# Patient Record
Sex: Male | Born: 1975 | Race: White | Hispanic: No | Marital: Married | Smoking: Never smoker
Health system: Southern US, Community
[De-identification: ages and names within clinical notes are randomized; demographics above are authoritative.]

## PROBLEM LIST (undated history)

## (undated) DIAGNOSIS — M549 Dorsalgia, unspecified: Secondary | ICD-10-CM

## (undated) DIAGNOSIS — I10 Essential (primary) hypertension: Secondary | ICD-10-CM

## (undated) HISTORY — PX: EYE SURGERY: SHX253

---

## 2009-02-27 ENCOUNTER — Emergency Department (HOSPITAL_COMMUNITY): Admission: EM | Admit: 2009-02-27 | Discharge: 2009-02-27 | Payer: Self-pay | Admitting: Emergency Medicine

## 2009-03-06 ENCOUNTER — Emergency Department (HOSPITAL_COMMUNITY): Admission: EM | Admit: 2009-03-06 | Discharge: 2009-03-06 | Payer: Self-pay | Admitting: Emergency Medicine

## 2009-06-17 ENCOUNTER — Encounter: Admission: RE | Admit: 2009-06-17 | Discharge: 2009-06-17 | Payer: Self-pay | Admitting: Nurse Practitioner

## 2010-03-22 ENCOUNTER — Emergency Department (HOSPITAL_COMMUNITY): Admission: EM | Admit: 2010-03-22 | Discharge: 2010-03-22 | Payer: Self-pay | Admitting: Emergency Medicine

## 2011-09-21 ENCOUNTER — Emergency Department (HOSPITAL_COMMUNITY): Payer: Self-pay

## 2011-09-21 ENCOUNTER — Emergency Department (HOSPITAL_COMMUNITY)
Admission: EM | Admit: 2011-09-21 | Discharge: 2011-09-21 | Disposition: A | Discharge status: Home / Self Care | Payer: Self-pay | Attending: Emergency Medicine | Admitting: Emergency Medicine

## 2011-09-21 ENCOUNTER — Encounter (HOSPITAL_COMMUNITY): Payer: Self-pay | Admitting: *Deleted

## 2011-09-21 DIAGNOSIS — S8011XA Contusion of right lower leg, initial encounter: Secondary | ICD-10-CM

## 2011-09-21 DIAGNOSIS — M25569 Pain in unspecified knee: Secondary | ICD-10-CM | POA: Insufficient documentation

## 2011-09-21 DIAGNOSIS — Y9239 Other specified sports and athletic area as the place of occurrence of the external cause: Secondary | ICD-10-CM | POA: Insufficient documentation

## 2011-09-21 DIAGNOSIS — M79606 Pain in leg, unspecified: Secondary | ICD-10-CM

## 2011-09-21 DIAGNOSIS — S8990XA Unspecified injury of unspecified lower leg, initial encounter: Secondary | ICD-10-CM | POA: Insufficient documentation

## 2011-09-21 DIAGNOSIS — M25469 Effusion, unspecified knee: Secondary | ICD-10-CM | POA: Insufficient documentation

## 2011-09-21 DIAGNOSIS — Y92838 Other recreation area as the place of occurrence of the external cause: Secondary | ICD-10-CM | POA: Insufficient documentation

## 2011-09-21 DIAGNOSIS — S8010XA Contusion of unspecified lower leg, initial encounter: Secondary | ICD-10-CM | POA: Insufficient documentation

## 2011-09-21 DIAGNOSIS — W010XXA Fall on same level from slipping, tripping and stumbling without subsequent striking against object, initial encounter: Secondary | ICD-10-CM | POA: Insufficient documentation

## 2011-09-21 DIAGNOSIS — Y9367 Activity, basketball: Secondary | ICD-10-CM | POA: Insufficient documentation

## 2011-09-21 MED ORDER — OXYCODONE-ACETAMINOPHEN 5-325 MG PO TABS
2.0000 | ORAL_TABLET | Freq: Once | ORAL | Status: AC
  Administered 2011-09-21: 2 via ORAL
  Filled 2011-09-21: qty 2

## 2011-09-21 MED ORDER — HYDROCODONE-ACETAMINOPHEN 5-500 MG PO TABS: 1.0000 | ORAL_TABLET | Freq: Four times a day (QID) | ORAL | Status: AC | PRN

## 2011-09-21 NOTE — Discharge Instructions (Signed)
Continue to ice and elevate for pain control. You may continue to use Aleve for inflammation and pain, using hydrocodone-acetaminophen for breakthrough pain. However do not drive or operate machinery with hydrocodone-acetaminophen use. Common side effects with hydrocodone-acetaminophen are sedation, nausea, and constipation and therefore you may need an over-the-counter stool softener. Followup with orthopedic specialist in the next 1 to 2 weeks for recheck of ongoing pain however return to emergency department for any changing or worsening of symptoms.  Contusion A contusion is a deep bruise. Contusions are the result of an injury that caused bleeding under the skin. The contusion may turn blue, purple, or yellow. Minor injuries will give you a painless contusion, but more severe contusions may stay painful and swollen for a few weeks.  CAUSES  A contusion is usually caused by a blow, trauma, or direct force to an area of the body. SYMPTOMS   Swelling and redness of the injured area.   Bruising of the injured area.   Tenderness and soreness of the injured area.   Pain.  DIAGNOSIS  The diagnosis can be made by taking a history and physical exam. An X-ray, CT scan, or MRI may be needed to determine if there were any associated injuries, such as fractures. TREATMENT  Specific treatment will depend on what area of the body was injured. In general, the best treatment for a contusion is resting, icing, elevating, and applying cold compresses to the injured area. Over-the-counter medicines may also be recommended for pain control. Ask your caregiver what the best treatment is for your contusion. HOME CARE INSTRUCTIONS   Put ice on the injured area.   Put ice in a plastic bag.   Place a towel between your skin and the bag.   Leave the ice on for 15 to 20 minutes, 3 to 4 times a day.   Only take over-the-counter or prescription medicines for pain, discomfort, or fever as directed by your  caregiver. Your caregiver may recommend avoiding anti-inflammatory medicines (aspirin, ibuprofen, and naproxen) for 48 hours because these medicines may increase bruising.   Rest the injured area.   If possible, elevate the injured area to reduce swelling.  SEEK IMMEDIATE MEDICAL CARE IF:   You have increased bruising or swelling.   You have pain that is getting worse.   Your swelling or pain is not relieved with medicines.  MAKE SURE YOU:   Understand these instructions.   Will watch your condition.   Will get help right away if you are not doing well or get worse.  Document Released: 02/23/2005 Document Revised: 05/05/2011 Document Reviewed: 03/21/2011 [facility] Patient Information 2012 [place], [place].

## 2011-09-21 NOTE — ED Provider Notes (Signed)
History     CSN: [id number]  Arrival date & time 09/21/11  1821   First MD Initiated Contact with Patient 09/21/11 1853      Chief Complaint  Patient presents with  . Knee Pain    (Consider location/radiation/quality/duration/timing/severity/associated sxs/prior treatment) HPI  Patient presents to emergency department complaining of right knee and lower leg injury. Patient states that 4 days ago he was playing basketball and fell, twisting and landing on his right leg. Patient states that immediately he began to have pain and swelling that evening. Patient states that by the next day the swelling has improved but he had gradual onset of bruising to the medial aspect of his knee down to the medial to anterior aspect of his lower leg. Patient states that he has gone to work every day where he stands and each day the pain has gradually worsened. Patient states pain is aggravated by movement and prolonged standing. Patient has been taking Aleve without relief of pain. Patient states he has no known medical problems and takes no medicines on regular basis. He denies additional injury. Patient states he is able to ambulate by ambulation and weightbearing that aggravates pain in his right leg.  History reviewed. No pertinent past medical history.  History reviewed. No pertinent past surgical history.  History reviewed. No pertinent family history.  History  Substance Use Topics  . Smoking status: Not on file  . Smokeless tobacco: Not on file  . Alcohol Use: No      Review of Systems  All other systems reviewed and are negative.    Allergies  Review of patient's allergies indicates no known allergies.  Home Medications  No current outpatient prescriptions on file.  BP 164/110  Pulse 96  Temp(Src) 98.1 F (36.7 C) (Oral)  Resp 18  SpO2 98%  Physical Exam  Nursing note and vitals reviewed. Constitutional: He is oriented to person, place, and time. He appears  well-developed.  HENT:  Head: Normocephalic and atraumatic.  Eyes: Conjunctivae are normal.  Neck: Normal range of motion. Neck supple.  Cardiovascular: Normal rate and regular rhythm.   Pulmonary/Chest: Effort normal and breath sounds normal. He exhibits no tenderness.  Abdominal: Soft. He exhibits no distension. There is no tenderness.  Musculoskeletal: Normal range of motion. He exhibits tenderness.       Tenderness to palpation of right medial knee with decreased range of motion of the knee due to pain but no swelling. Faint bruising extending from anterior medial right knee down to anterior medial shin. Tenderness to palpation of the anterior medial shin. Full range of motion of right ankle with no tenderness to palpation. No tenderness to palpation of entire foot. Good pedal pulse and cap refill of toes. Full range of motion of bilateral hips without pain. Full range of motion of bilateral upper extremities without pain. Full range of motion of neck and spine without pain.  Neurological: He is alert and oriented to person, place, and time.  Skin: Skin is warm and dry. No rash noted. No erythema. No pallor.    ED Course  Procedures (including critical care time)  By mouth Percocet  Labs Reviewed - No data to display Dg Tibia/fibula Right  09/21/2011  *RADIOLOGY REPORT*  Clinical Data: Right leg pain.  The medial knee pain.  RIGHT TIBIA AND FIBULA - 2 VIEW  Comparison: None.  Findings: The knee and ankle joints are intact.  No acute bone or soft tissue abnormality is present.  IMPRESSION: Negative  tibia and fibula.  Original Report Authenticated By: Jamesetta Orleans. MATTERN, M.D.   Dg Knee Complete 4 Views Right  09/21/2011  *RADIOLOGY REPORT*  Clinical Data: Right knee pain and swelling.  Basketball injury.  RIGHT KNEE - COMPLETE 4+ VIEW  Comparison: None.  Findings: The right knee is located.  No acute bone or soft tissue abnormality is present.  There is no significant effusion.  Minimal  degenerative changes are noted at the patella.  IMPRESSION:  1.  No acute abnormality. 2.  Minimal degenerative changes of the patella.  Original Report Authenticated By: Jamesetta Orleans. MATTERN, M.D.     1. Contusion of right lower extremity   2. Lower extremity pain       MDM  Right lower extremity is neurovascularly intact. No acute findings on knee and tib-fib x-ray. Good pedal pulse and cap refill. No signs or symptoms of DVT. No tenderness to palpation of ankle or foot. Full range of motion of bilateral hips without pain. Patient denies additional injury. Spoke at length about conservative management of RICE as well as following up with orthopedic specialist however to return to emergency department for emergent changing or worsening symptoms. Patient voices understanding and is agreeable plan.        Jenness Corner, [place] 09/21/11 2002

## 2011-09-21 NOTE — ED Notes (Signed)
Pt reports injuring right leg while playing bball on Sunday, large bruise noted to right lower leg, swelling noted to knee. Ambulatory at triage.

## 2011-09-21 NOTE — ED Notes (Signed)
Abrasion to right elbow and left knee

## 2011-09-22 NOTE — ED Provider Notes (Signed)
Medical screening examination/treatment/procedure(s) were performed by non-physician practitioner and as supervising physician I was immediately available for consultation/collaboration.  Adhrit Krenz, MD 09/22/11 1509 

## 2012-10-25 ENCOUNTER — Encounter (HOSPITAL_BASED_OUTPATIENT_CLINIC_OR_DEPARTMENT_OTHER): Payer: Self-pay | Admitting: *Deleted

## 2012-10-25 ENCOUNTER — Emergency Department (HOSPITAL_BASED_OUTPATIENT_CLINIC_OR_DEPARTMENT_OTHER)
Admission: EM | Admit: 2012-10-25 | Discharge: 2012-10-25 | Disposition: A | Discharge status: Home / Self Care | Payer: Worker's Compensation | Attending: Emergency Medicine | Admitting: Emergency Medicine

## 2012-10-25 DIAGNOSIS — W208XXA Other cause of strike by thrown, projected or falling object, initial encounter: Secondary | ICD-10-CM | POA: Insufficient documentation

## 2012-10-25 DIAGNOSIS — Y9269 Other specified industrial and construction area as the place of occurrence of the external cause: Secondary | ICD-10-CM | POA: Insufficient documentation

## 2012-10-25 DIAGNOSIS — M549 Dorsalgia, unspecified: Secondary | ICD-10-CM

## 2012-10-25 DIAGNOSIS — Z791 Long term (current) use of non-steroidal anti-inflammatories (NSAID): Secondary | ICD-10-CM | POA: Insufficient documentation

## 2012-10-25 DIAGNOSIS — Y9389 Activity, other specified: Secondary | ICD-10-CM | POA: Insufficient documentation

## 2012-10-25 DIAGNOSIS — Z79899 Other long term (current) drug therapy: Secondary | ICD-10-CM | POA: Insufficient documentation

## 2012-10-25 DIAGNOSIS — S139XXA Sprain of joints and ligaments of unspecified parts of neck, initial encounter: Secondary | ICD-10-CM | POA: Insufficient documentation

## 2012-10-25 DIAGNOSIS — S161XXA Strain of muscle, fascia and tendon at neck level, initial encounter: Secondary | ICD-10-CM

## 2012-10-25 MED ORDER — KETOROLAC TROMETHAMINE 30 MG/ML IJ SOLN
30.0000 mg | Freq: Once | INTRAMUSCULAR | Status: AC
  Administered 2012-10-25: 30 mg via INTRAMUSCULAR
  Filled 2012-10-25: qty 1

## 2012-10-25 MED ORDER — DIAZEPAM 5 MG PO TABS: 5.0000 mg | ORAL_TABLET | Freq: Three times a day (TID) | ORAL | Status: DC | PRN

## 2012-10-25 MED ORDER — OXYCODONE-ACETAMINOPHEN 5-325 MG PO TABS: 1.0000 | ORAL_TABLET | ORAL | Status: DC | PRN

## 2012-10-25 MED ORDER — NAPROXEN 375 MG PO TABS: 375.0000 mg | ORAL_TABLET | Freq: Two times a day (BID) | ORAL | Status: DC | PRN

## 2012-10-25 NOTE — ED Notes (Signed)
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 Now having pain down middle of back has become stiff and having increased neck pain. No loss of consciousness reported

## 2012-10-25 NOTE — ED Provider Notes (Signed)
History    37 year old male with neck and upper back pain. Patient was struck in the head yesterday with a large tree limb. He was wearing hard hat. There is no loss of consciousness. No significant pain initially and patient worked for several more hours. He began developing worsening pain when he went home and much more severe when he woke up today. He tried taking ibuprofen with mild relief. No acute numbness, tingling loss of strength. No headache. No visual changes. He feels spelled spine. Denies or vomiting. No use of blood thinning medication.  CSN: [id number]  Arrival date & time 10/25/12  4782   First MD Initiated Contact with Patient 10/25/12 [phone]      Chief Complaint  Patient presents with  . Neck Pain    (Consider location/radiation/quality/duration/timing/severity/associated sxs/prior treatment) HPI  History reviewed. No pertinent past medical history.  History reviewed. No pertinent past surgical history.  History reviewed. No pertinent family history.  History  Substance Use Topics  . Smoking status: Never Smoker   . Smokeless tobacco: Not on file  . Alcohol Use: No      Review of Systems  Allergies  Review of patient's allergies indicates no known allergies.  Home Medications   Current Outpatient Rx  Name  Route  Sig  Dispense  Refill  . diazepam (VALIUM) 5 MG tablet   Oral   Take 1 tablet (5 mg total) by mouth every 8 (eight) hours as needed (muscle spasm).   12 tablet   0   . naproxen (NAPROSYN) 375 MG tablet   Oral   Take 1 tablet (375 mg total) by mouth 2 (two) times daily as needed (pain).   20 tablet   0   . oxyCODONE-acetaminophen (PERCOCET/ROXICET) 5-325 MG per tablet   Oral   Take 1-2 tablets by mouth every 4 (four) hours as needed for pain.   12 tablet   0     BP 156/110  Pulse 86  Temp(Src) 97.8 F (36.6 C) (Oral)  SpO2 100%  Physical Exam  Nursing note and vitals reviewed. Constitutional: He appears well-developed and  well-nourished. No distress.  HENT:  Head: Normocephalic and atraumatic.  Eyes: Conjunctivae are normal. Right eye exhibits no discharge. Left eye exhibits no discharge.  Neck: Neck supple.  Cardiovascular: Normal rate, regular rhythm and normal heart sounds.  Exam reveals no gallop and no friction rub.   No murmur heard. Pulmonary/Chest: Effort normal and breath sounds normal. No respiratory distress.  Abdominal: Soft. He exhibits no distension. There is no tenderness.  Musculoskeletal: He exhibits tenderness. He exhibits no edema.       Arms: Mild tenderness in the depicted areas. No concerning skin changes.  Neurological: He is alert. He exhibits normal muscle tone. Coordination normal.  Strength is 5 out of 5 bilateral upper and lower extremities. Gait is steady.  Skin: Skin is warm and dry.  Psychiatric: He has a normal mood and affect. His behavior is normal. Thought content normal.    ED Course  Procedures (including critical care time)  Labs Reviewed - No data to display No results found.   1. Back pain, acute   2. Neck strain, initial encounter       MDM  36yM with neck/upper back pain after being struck in head. Delayed onset of symptoms. Suspect sprain/strain. Very low suspicion for fx or cord injury. Plan symptomatic tx. Return precautions discussed.         Raeford Razor, MD 10/25/12 [phone]

## 2012-10-29 ENCOUNTER — Emergency Department (HOSPITAL_BASED_OUTPATIENT_CLINIC_OR_DEPARTMENT_OTHER)
Admission: EM | Admit: 2012-10-29 | Discharge: 2012-10-29 | Disposition: A | Discharge status: Home / Self Care | Payer: Worker's Compensation | Attending: Emergency Medicine | Admitting: Emergency Medicine

## 2012-10-29 ENCOUNTER — Encounter (HOSPITAL_BASED_OUTPATIENT_CLINIC_OR_DEPARTMENT_OTHER): Payer: Self-pay | Admitting: Emergency Medicine

## 2012-10-29 DIAGNOSIS — Y939 Activity, unspecified: Secondary | ICD-10-CM | POA: Insufficient documentation

## 2012-10-29 DIAGNOSIS — S161XXD Strain of muscle, fascia and tendon at neck level, subsequent encounter: Secondary | ICD-10-CM

## 2012-10-29 DIAGNOSIS — S139XXA Sprain of joints and ligaments of unspecified parts of neck, initial encounter: Secondary | ICD-10-CM | POA: Insufficient documentation

## 2012-10-29 DIAGNOSIS — IMO0002 Reserved for concepts with insufficient information to code with codable children: Secondary | ICD-10-CM | POA: Insufficient documentation

## 2012-10-29 DIAGNOSIS — Y929 Unspecified place or not applicable: Secondary | ICD-10-CM | POA: Insufficient documentation

## 2012-10-29 DIAGNOSIS — M549 Dorsalgia, unspecified: Secondary | ICD-10-CM

## 2012-10-29 DIAGNOSIS — W208XXA Other cause of strike by thrown, projected or falling object, initial encounter: Secondary | ICD-10-CM | POA: Insufficient documentation

## 2012-10-29 MED ORDER — HYDROCODONE-ACETAMINOPHEN 5-325 MG PO TABS: 1.0000 | ORAL_TABLET | ORAL | Status: DC | PRN

## 2012-10-29 NOTE — ED Provider Notes (Signed)
Medical screening examination/treatment/procedure(s) were performed by non-physician practitioner and as supervising physician I was immediately available for consultation/collaboration.   Royston Bekele, MD 10/29/12 2217 

## 2012-10-29 NOTE — ED Notes (Signed)
Seen here 4 days ago after having a tree fall on him.  Was told to come back if the pain did not get any better and it has not.  Has appt with [facility] next Monday.

## 2012-10-29 NOTE — ED Provider Notes (Signed)
History     CSN: [id number]  Arrival date & time 10/29/12  1557   First MD Initiated Contact with Patient 10/29/12 1608      Chief Complaint  Patient presents with  . Back Pain  . Neck Pain    (Consider location/radiation/quality/duration/timing/severity/associated sxs/prior treatment) HPI Comments: Pt states that 5 days ago a tree fell on him:pt states that he was seen 4 days ago for neck and back pain:pt denies numbness, incontinence or weakness:pt states that he tried to go back to work today but the pain started back:pt states that work took him out again for a week and he is scheduled to see [facility] in a week, but he is here today because he needs something for pain  The history is provided by the patient. No language interpreter was used.    History reviewed. No pertinent past medical history.  Past Surgical History  Procedure Laterality Date  . Eye surgery      No family history on file.  History  Substance Use Topics  . Smoking status: Never Smoker   . Smokeless tobacco: Not on file  . Alcohol Use: No      Review of Systems  Constitutional: Negative.   Respiratory: Negative.   Cardiovascular: Negative.   Genitourinary: Negative for dysuria.  Neurological: Negative for dizziness.    Allergies  Review of patient's allergies indicates no known allergies.  Home Medications   Current Outpatient Rx  Name  Route  Sig  Dispense  Refill  . diazepam (VALIUM) 5 MG tablet   Oral   Take 1 tablet (5 mg total) by mouth every 8 (eight) hours as needed (muscle spasm).   12 tablet   0   . naproxen (NAPROSYN) 375 MG tablet   Oral   Take 1 tablet (375 mg total) by mouth 2 (two) times daily as needed (pain).   20 tablet   0   . oxyCODONE-acetaminophen (PERCOCET/ROXICET) 5-325 MG per tablet   Oral   Take 1-2 tablets by mouth every 4 (four) hours as needed for pain.   12 tablet   0     BP 168/109  Pulse 90  Temp(Src) 98.1 F (36.7 C) (Oral)   Resp 18  SpO2 97%  Physical Exam  Nursing note and vitals reviewed. Constitutional: He is oriented to person, place, and time. He appears well-developed and well-nourished.  HENT:  Head: Normocephalic and atraumatic.  Cardiovascular: Normal rate and regular rhythm.   Pulmonary/Chest: Effort normal and breath sounds normal.  Musculoskeletal:  Pt has paraspinal tenderness in there cervical and thoracic spine:grip strength equal:ambulating without any problem  Neurological: He is alert and oriented to person, place, and time.  Skin: Skin is warm and dry.    ED Course  Procedures (including critical care time)  Labs Reviewed - No data to display No results found.   1. Back pain   2. Cervical strain, subsequent encounter       MDM  Will given something more for pain and follow up as planned:dont think imaging is needed        Teressa Lower, NP 10/29/12 1824

## 2013-02-21 ENCOUNTER — Emergency Department (HOSPITAL_BASED_OUTPATIENT_CLINIC_OR_DEPARTMENT_OTHER): Payer: 59

## 2013-02-21 ENCOUNTER — Encounter (HOSPITAL_BASED_OUTPATIENT_CLINIC_OR_DEPARTMENT_OTHER): Payer: Self-pay | Admitting: *Deleted

## 2013-02-21 ENCOUNTER — Emergency Department (HOSPITAL_BASED_OUTPATIENT_CLINIC_OR_DEPARTMENT_OTHER)
Admission: EM | Admit: 2013-02-21 | Discharge: 2013-02-21 | Disposition: A | Discharge status: Home / Self Care | Payer: 59 | Attending: Emergency Medicine | Admitting: Emergency Medicine

## 2013-02-21 DIAGNOSIS — IMO0001 Reserved for inherently not codable concepts without codable children: Secondary | ICD-10-CM | POA: Insufficient documentation

## 2013-02-21 DIAGNOSIS — M722 Plantar fascial fibromatosis: Secondary | ICD-10-CM | POA: Insufficient documentation

## 2013-02-21 MED ORDER — HYDROCODONE-ACETAMINOPHEN 5-325 MG PO TABS: 2.0000 | ORAL_TABLET | ORAL | Status: DC | PRN

## 2013-02-21 MED ORDER — IBUPROFEN 800 MG PO TABS: 800.0000 mg | ORAL_TABLET | Freq: Three times a day (TID) | ORAL | Status: DC

## 2013-02-21 NOTE — ED Provider Notes (Signed)
CSN: [id number]     Arrival date & time 02/21/13  1915 History   First MD Initiated Contact with Patient 02/21/13 2028     Chief Complaint  Patient presents with  . Foot Pain   (Consider location/radiation/quality/duration/timing/severity/associated sxs/prior Treatment) Patient is a 37 y.o. male presenting with lower extremity pain. The history is provided by the patient. No language interpreter was used.  Foot Pain This is a new problem. The current episode started today. The problem occurs constantly. The problem has been gradually worsening. Associated symptoms include joint swelling and myalgias. The symptoms are aggravated by walking. He has tried nothing for the symptoms. The treatment provided moderate relief.  Pt complains of pain in his left heel.   Pain worse with walking  History reviewed. No pertinent past medical history. Past Surgical History  Procedure Laterality Date  . Eye surgery     History reviewed. No pertinent family history. History  Substance Use Topics  . Smoking status: Never Smoker   . Smokeless tobacco: Not on file  . Alcohol Use: No    Review of Systems  Musculoskeletal: Positive for myalgias and joint swelling.  All other systems reviewed and are negative.    Allergies  Review of patient's allergies indicates no known allergies.  Home Medications  No current outpatient prescriptions on file. BP 147/96  Pulse 85  Temp(Src) 98.2 F (36.8 C) (Oral)  Resp 18  Ht 6\' 2"  (1.88 m)  Wt 250 lb (113.399 kg)  BMI 32.08 kg/m2  SpO2 98% Physical Exam  Vitals reviewed. Constitutional: He is oriented to person, place, and time. He appears well-developed and well-nourished.  Cardiovascular: Normal rate.   Pulmonary/Chest: Effort normal.  Musculoskeletal: He exhibits tenderness.  Neurological: He is alert and oriented to person, place, and time.  Skin: Skin is warm.  Psychiatric: He has a normal mood and affect.    ED Course  Procedures (including  critical care time) Labs Review Labs Reviewed - No data to display Imaging Review Dg Foot Complete Left  02/21/2013   CLINICAL DATA:  Left heel pain for the past 4 days. No known injury.  EXAM: LEFT FOOT - COMPLETE 3+ VIEW  COMPARISON:  06/17/2009.  FINDINGS: Mild posterior and minimal inferior calcaneal spur formation. Otherwise, normal appearing bones and soft tissues.  IMPRESSION: Mild calcaneal spur formation. No acute abnormality.   Electronically Signed   By: Gordan Payment   On: 02/21/2013 20:30    MDM   1. Plantar fasciitis of left foot    Pt referred to Dr. Pearletha Forge for evaluation,   Rx for ibuprofen and hydrocodone    Elson Areas, PA-C 02/21/13 2100

## 2013-02-21 NOTE — ED Notes (Signed)
PA at bedside.

## 2013-02-21 NOTE — ED Notes (Signed)
Pt reports (L) heel pain since Sunday.  Denies injury

## 2013-02-21 NOTE — ED Provider Notes (Signed)
Medical screening examination/treatment/procedure(s) were performed by non-physician practitioner and as supervising physician I was immediately available for consultation/collaboration.   Charles B. Sheldon, MD 02/21/13 2353 

## 2013-03-19 ENCOUNTER — Emergency Department (HOSPITAL_COMMUNITY)
Admission: EM | Admit: 2013-03-19 | Discharge: 2013-03-19 | Disposition: A | Discharge status: Home / Self Care | Payer: 59 | Attending: Emergency Medicine | Admitting: Emergency Medicine

## 2013-03-19 ENCOUNTER — Encounter (HOSPITAL_COMMUNITY): Payer: Self-pay | Admitting: Emergency Medicine

## 2013-03-19 DIAGNOSIS — K089 Disorder of teeth and supporting structures, unspecified: Secondary | ICD-10-CM | POA: Insufficient documentation

## 2013-03-19 DIAGNOSIS — H9209 Otalgia, unspecified ear: Secondary | ICD-10-CM | POA: Insufficient documentation

## 2013-03-19 DIAGNOSIS — K029 Dental caries, unspecified: Secondary | ICD-10-CM | POA: Insufficient documentation

## 2013-03-19 MED ORDER — PENICILLIN V POTASSIUM 500 MG PO TABS: 500.0000 mg | ORAL_TABLET | Freq: Four times a day (QID) | ORAL | Status: DC

## 2013-03-19 MED ORDER — HYDROCODONE-ACETAMINOPHEN 5-325 MG PO TABS: 1.0000 | ORAL_TABLET | ORAL | Status: DC | PRN

## 2013-03-19 NOTE — ED Notes (Signed)
Pt reports toothache. States the he has an appt to get his teeth pulled later this week. Denies any other symptoms at this time.

## 2013-03-19 NOTE — ED Provider Notes (Signed)
CSN: [id number]     Arrival date & time 03/19/13  1814 History  This chart was scribed for non-physician practitioner Dierdre Forth, PA-C working with Derwood Kaplan, MD by Clydene Laming, ED Scribe. This patient was seen in room TR04C/TR04C and the patient's care was started at 8:41 PM.  Chief Complaint  Patient presents with  . Dental Pain    The history is provided by the patient. No language interpreter was used.   HPI Comments: Mitchell Brooks is a 37 y.o. male who presents to the Emergency Department complaining of lower right dental pain onset yesterday with associated facial pain and ear pain.  Pt report not eating secondary to pain. The pain is worsened with cold and hot foods and even breathing cold air.  Pt denies fever, h/a or chills. Pt has a hx of dental issues claiming multiple dental caries. Pt is taking ibuprofen with minimal relief. He has an appointment to have all his teeth pulled on 03/26/13.  No recent sickness, abscess or infection. Saw pcp last week with no issue. No meds. No allergies.  History reviewed. No pertinent past medical history. Past Surgical History  Procedure Laterality Date  . Eye surgery     History reviewed. No pertinent family history. History  Substance Use Topics  . Smoking status: Never Smoker   . Smokeless tobacco: Not on file  . Alcohol Use: No    Review of Systems  Constitutional: Negative for fever, chills and appetite change.  HENT: Positive for dental problem and ear pain. Negative for drooling, facial swelling, nosebleeds, postnasal drip, rhinorrhea and trouble swallowing.   Eyes: Negative for pain and redness.  Respiratory: Negative for cough and wheezing.   Cardiovascular: Negative for chest pain.  Gastrointestinal: Negative for nausea, vomiting and abdominal pain.  Musculoskeletal: Negative for neck pain and neck stiffness.  Skin: Negative for color change and rash.  Neurological: Negative for weakness, light-headedness and  headaches.  All other systems reviewed and are negative.    Allergies  Review of patient's allergies indicates no known allergies.  Home Medications   Current Outpatient Rx  Name  Route  Sig  Dispense  Refill  . ibuprofen (ADVIL,MOTRIN) 200 MG tablet   Oral   Take 400 mg by mouth every 6 (six) hours as needed for pain.         Marland Kitchen HYDROcodone-acetaminophen (NORCO/VICODIN) 5-325 MG per tablet   Oral   Take 1-2 tablets by mouth every 4 (four) hours as needed for pain.   21 tablet   0   . penicillin v potassium (VEETID) 500 MG tablet   Oral   Take 1 tablet (500 mg total) by mouth 4 (four) times daily.   40 tablet   0    Triage Vitals:BP 171/121  Pulse 77  Temp(Src) 98.4 F (36.9 C) (Oral)  Resp 20  SpO2 97% Physical Exam  Nursing note and vitals reviewed. Constitutional: He is oriented to person, place, and time. He appears well-developed and well-nourished.  HENT:  Head: Normocephalic.  Right Ear: Tympanic membrane, external ear and ear canal normal.  Left Ear: Tympanic membrane, external ear and ear canal normal.  Nose: Nose normal. Right sinus exhibits no maxillary sinus tenderness and no frontal sinus tenderness. Left sinus exhibits no maxillary sinus tenderness and no frontal sinus tenderness.  Mouth/Throat: Uvula is midline, oropharynx is clear and moist and mucous membranes are normal. No oral lesions. Abnormal dentition. Dental caries present. No dental abscesses, uvula swelling or lacerations. No  oropharyngeal exudate, posterior oropharyngeal edema, posterior oropharyngeal erythema or tonsillar abscesses.  Dental caries and broken teeth throughout No gross abscess  Eyes: Conjunctivae are normal. Pupils are equal, round, and reactive to light. Right eye exhibits no discharge. Left eye exhibits no discharge.  Neck: Normal range of motion, full passive range of motion without pain and phonation normal. Neck supple. No spinous process tenderness and no muscular  tenderness present. No rigidity. Normal range of motion present.  Cardiovascular: Normal rate, regular rhythm and normal heart sounds.   Pulmonary/Chest: Effort normal and breath sounds normal. No stridor. No respiratory distress. He has no wheezes.  Abdominal: Soft. Bowel sounds are normal. He exhibits no distension. There is no tenderness.  Lymphadenopathy:       Head (right side): No submental, no submandibular, no tonsillar, no preauricular, no posterior auricular and no occipital adenopathy present.       Head (left side): No submental, no submandibular, no tonsillar, no preauricular, no posterior auricular and no occipital adenopathy present.    He has no cervical adenopathy.       Right cervical: No superficial cervical, no deep cervical and no posterior cervical adenopathy present.      Left cervical: No superficial cervical, no deep cervical and no posterior cervical adenopathy present.  No lymphadenopathy  Neurological: He is alert and oriented to person, place, and time. He exhibits normal muscle tone. Coordination normal.  Skin: Skin is warm and dry.  Psychiatric: He has a normal mood and affect.    ED Course  Dental Date/Time: 03/19/2013 8:45 PM Performed by: Dierdre Forth Authorized by: Dierdre Forth Consent: Verbal consent obtained. Risks and benefits: risks, benefits and alternatives were discussed Consent given by: patient Patient understanding: patient states understanding of the procedure being performed Patient consent: the patient's understanding of the procedure matches consent given Procedure consent: procedure consent matches procedure scheduled Relevant documents: relevant documents present and verified Site marked: the operative site was marked Required items: required blood products, implants, devices, and special equipment available Patient identity confirmed: verbally with patient and arm band Time out: Immediately prior to procedure a "time  out" was called to verify the correct patient, procedure, equipment, support staff and site/side marked as required. Preparation: Patient was prepped and draped in the usual sterile fashion. Local anesthesia used: yes Local anesthetic: bupivacaine 0.5% with epinephrine Anesthetic total: 1.5 ml Patient sedated: no Patient tolerance: Patient tolerated the procedure well with no immediate complications. Comments: Dental block of tooth #32, 29 and 28 with complete relief of pain and without complication   (including critical care time) DIAGNOSTIC STUDIES: Oxygen Saturation is 97% on RA, normal by my interpretation.    COORDINATION OF CARE: 8:52 PM- Discussed treatment plan with pt at bedside. Pt verbalized understanding and agreement with plan.   Labs Review Labs Reviewed - No data to display Imaging Review No results found.  EKG Interpretation   None       MDM   1. Pain due to dental caries      Gwyndolyn Kaufman presents with dental pain and dental caries throughout.  Patient with toothache relieved with dental block and without complication.  No gross abscess.  Exam unconcerning for Ludwig's angina or spread of infection.  Will treat with penicillin and pain medicine.  Urged patient to follow-up with dentist.     It has been determined that no acute conditions requiring further emergency intervention are present at this time. The patient/guardian have been advised of the diagnosis  and plan. We have discussed signs and symptoms that warrant return to the ED, such as changes or worsening in symptoms.   Vital signs are stable at discharge.   BP 157/107  Pulse 80  Temp(Src) 98.4 F (36.9 C) (Oral)  Resp 14  SpO2 98%  Patient/guardian has voiced understanding and agreed to follow-up with the PCP or specialist.    I personally performed the services described in this documentation, which was scribed in my presence. The recorded information has been reviewed and is  accurate.     Dahlia Client Randell Detter, PA-C 03/19/13 2103

## 2013-03-20 NOTE — ED Provider Notes (Signed)
Medical screening examination/treatment/procedure(s) were performed by non-physician practitioner and as supervising physician I was immediately available for consultation/collaboration.   Derwood Kaplan, MD 03/20/13 1515

## 2013-05-13 ENCOUNTER — Encounter (HOSPITAL_BASED_OUTPATIENT_CLINIC_OR_DEPARTMENT_OTHER): Payer: Self-pay | Admitting: Emergency Medicine

## 2013-05-13 ENCOUNTER — Emergency Department (HOSPITAL_BASED_OUTPATIENT_CLINIC_OR_DEPARTMENT_OTHER)
Admission: EM | Admit: 2013-05-13 | Discharge: 2013-05-13 | Disposition: A | Discharge status: Home / Self Care | Payer: 59 | Attending: Emergency Medicine | Admitting: Emergency Medicine

## 2013-05-13 DIAGNOSIS — Y990 Civilian activity done for income or pay: Secondary | ICD-10-CM | POA: Insufficient documentation

## 2013-05-13 DIAGNOSIS — X503XXA Overexertion from repetitive movements, initial encounter: Secondary | ICD-10-CM | POA: Insufficient documentation

## 2013-05-13 DIAGNOSIS — Z792 Long term (current) use of antibiotics: Secondary | ICD-10-CM | POA: Insufficient documentation

## 2013-05-13 DIAGNOSIS — M546 Pain in thoracic spine: Secondary | ICD-10-CM

## 2013-05-13 DIAGNOSIS — Y93G1 Activity, food preparation and clean up: Secondary | ICD-10-CM | POA: Insufficient documentation

## 2013-05-13 DIAGNOSIS — R000 Tachycardia, unspecified: Secondary | ICD-10-CM | POA: Insufficient documentation

## 2013-05-13 DIAGNOSIS — S239XXA Sprain of unspecified parts of thorax, initial encounter: Secondary | ICD-10-CM | POA: Insufficient documentation

## 2013-05-13 DIAGNOSIS — Y9289 Other specified places as the place of occurrence of the external cause: Secondary | ICD-10-CM | POA: Insufficient documentation

## 2013-05-13 HISTORY — DX: Dorsalgia, unspecified: M54.9

## 2013-05-13 MED ORDER — HYDROCODONE-ACETAMINOPHEN 5-325 MG PO TABS: 2.0000 | ORAL_TABLET | ORAL | Status: DC | PRN

## 2013-05-13 MED ORDER — PREDNISONE 10 MG PO TABS: 20.0000 mg | ORAL_TABLET | Freq: Two times a day (BID) | ORAL | Status: DC

## 2013-05-13 MED ORDER — HYDROCODONE-ACETAMINOPHEN 5-325 MG PO TABS
1.0000 | ORAL_TABLET | Freq: Once | ORAL | Status: AC
  Administered 2013-05-13: 1 via ORAL
  Filled 2013-05-13: qty 1

## 2013-05-13 NOTE — ED Notes (Signed)
Mid back pain radiates down to toward the buttocks, 6/10 twisting sensation, heavy lifting at work, flexeril and tramadol didn't work. Pt is stable otherwise.

## 2013-05-13 NOTE — ED Provider Notes (Signed)
CSN: [id number]     Arrival date & time 05/13/13  1847 History   First MD Initiated Contact with Patient 05/13/13 2153     Chief Complaint  Patient presents with  . Back Pain   (Consider location/radiation/quality/duration/timing/severity/associated sxs/prior Treatment) Patient is a 37 y.o. male presenting with back pain. The history is provided by the patient.  Back Pain Location:  Thoracic spine Quality:  Aching and shooting Radiates to:  Does not radiate Pain severity:  Moderate Pain is:  Same all the time Onset quality:  Gradual Duration:  3 days Timing:  Constant Progression:  Worsening Chronicity:  New Context: not recent injury   Relieved by:  Nothing Worsened by:  Ambulation and movement Ineffective treatments:  Ibuprofen and muscle relaxants Associated symptoms: no abdominal pain, no bladder incontinence, no bowel incontinence, no chest pain, no dysuria, no fever, no headaches, no leg pain, no paresthesias and no weakness    Ledell Codrington is a 37 y.o. 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 He has a history of back problems. This pain started 3 days ago and has not improved with OTC medication and his muscle relaxant that he has from previous back pain.  Past Medical History  Diagnosis Date  . Back pain    Past Surgical History  Procedure Laterality Date  . Eye surgery     No family history on file. History  Substance Use Topics  . Smoking status: Never Smoker   . Smokeless tobacco: Not on file  . Alcohol Use: No    Review of Systems  Constitutional: Negative for fever.  HENT: Negative.   Eyes: Negative for visual disturbance.  Cardiovascular: Negative for chest pain.  Gastrointestinal: Negative for nausea, vomiting, abdominal pain and bowel incontinence.  Genitourinary: Negative for bladder incontinence, dysuria, urgency, frequency and decreased urine volume.  Musculoskeletal:  Positive for back pain.  Skin: Negative for wound.  Allergic/Immunologic: Negative for immunocompromised state.  Neurological: Negative for weakness, light-headedness, headaches and paresthesias.  Psychiatric/Behavioral: Negative for confusion. The patient is not nervous/anxious.     Allergies  Review of patient's allergies indicates no known allergies.  Home Medications   Current Outpatient Rx  Name  Route  Sig  Dispense  Refill  . Cyclobenzaprine HCl (FLEXERIL PO)   Oral   Take by mouth.         . TRAMADOL HCL ER PO   Oral   Take by mouth.         Marland Kitchen HYDROcodone-acetaminophen (NORCO/VICODIN) 5-325 MG per tablet   Oral   Take 1-2 tablets by mouth every 4 (four) hours as needed for pain.   21 tablet   0   . ibuprofen (ADVIL,MOTRIN) 200 MG tablet   Oral   Take 400 mg by mouth every 6 (six) hours as needed for pain.         Marland Kitchen penicillin v potassium (VEETID) 500 MG tablet   Oral   Take 1 tablet (500 mg total) by mouth 4 (four) times daily.   40 tablet   0    BP 135/88  Pulse 102  Temp(Src) 98.5 F (36.9 C) (Oral)  Resp 16  Ht 6\' 1"  (1.854 m)  Wt 255 lb (115.667 kg)  BMI 33.65 kg/m2  SpO2 100% Physical Exam  Nursing note and vitals reviewed. Constitutional: He is oriented to person, place, and time. He appears well-developed and well-nourished. No distress.  HENT:  Head: Normocephalic and atraumatic.  Eyes: EOM are normal.  Neck: Neck supple.  Cardiovascular: Regular rhythm.  Tachycardia present.   Pulmonary/Chest: Effort normal and breath sounds normal.  Abdominal: Soft. Bowel sounds are normal. There is no tenderness.  Musculoskeletal:       Thoracic back: He exhibits tenderness and spasm. He exhibits normal range of motion, no bony tenderness, no swelling, no deformity and normal pulse.       Back:  Neurological: He is alert and oriented to person, place, and time. He has normal strength and normal reflexes. No cranial nerve deficit or sensory  deficit. Gait normal.  Skin: Skin is warm and dry.  Psychiatric: He has a normal mood and affect. His behavior is normal.    ED Course  Procedures   MDM  37 y.o. male with thoracic muscle strain and spasm. Stable for discharge without any immediate complications. He will follow up with his PCP. Discussed with the patient plan of care and all questioned fully answered. He voices understanding.    Medication List    TAKE these medications       HYDROcodone-acetaminophen 5-325 MG per tablet  Commonly known as:  NORCO/VICODIN  Take 2 tablets by mouth every 4 (four) hours as needed.     predniSONE 10 MG tablet  Commonly known as:  DELTASONE  Take 2 tablets (20 mg total) by mouth 2 (two) times daily with a meal.      ASK your doctor about these medications       FLEXERIL PO  Take by mouth.     ibuprofen 200 MG tablet  Commonly known as:  ADVIL,MOTRIN  Take 400 mg by mouth every 6 (six) hours as needed for pain.     penicillin v potassium 500 MG tablet  Commonly known as:  VEETID  Take 1 tablet (500 mg total) by mouth 4 (four) times daily.     TRAMADOL HCL ER PO  Take by mouth.            [facility] Orlene Och, NP 05/14/13 [phone]

## 2013-05-13 NOTE — ED Notes (Signed)
C/o lower and mid back pain x 3 days after working-denies specific

## 2013-05-15 NOTE — ED Provider Notes (Signed)
Medical screening examination/treatment/procedure(s) were performed by non-physician practitioner and as supervising physician I was immediately available for consultation/collaboration.  EKG Interpretation   None        Deundre Thong, MD 05/15/13 0828 

## 2013-09-16 ENCOUNTER — Emergency Department (HOSPITAL_BASED_OUTPATIENT_CLINIC_OR_DEPARTMENT_OTHER)
Admission: EM | Admit: 2013-09-16 | Discharge: 2013-09-16 | Disposition: A | Discharge status: Home / Self Care | Payer: 59 | Attending: Emergency Medicine | Admitting: Emergency Medicine

## 2013-09-16 ENCOUNTER — Encounter (HOSPITAL_BASED_OUTPATIENT_CLINIC_OR_DEPARTMENT_OTHER): Payer: Self-pay | Admitting: Emergency Medicine

## 2013-09-16 DIAGNOSIS — S139XXA Sprain of joints and ligaments of unspecified parts of neck, initial encounter: Secondary | ICD-10-CM | POA: Insufficient documentation

## 2013-09-16 DIAGNOSIS — S335XXA Sprain of ligaments of lumbar spine, initial encounter: Secondary | ICD-10-CM | POA: Insufficient documentation

## 2013-09-16 DIAGNOSIS — S39012A Strain of muscle, fascia and tendon of lower back, initial encounter: Secondary | ICD-10-CM

## 2013-09-16 DIAGNOSIS — Y9389 Activity, other specified: Secondary | ICD-10-CM | POA: Insufficient documentation

## 2013-09-16 DIAGNOSIS — S161XXA Strain of muscle, fascia and tendon at neck level, initial encounter: Secondary | ICD-10-CM

## 2013-09-16 DIAGNOSIS — Y92410 Unspecified street and highway as the place of occurrence of the external cause: Secondary | ICD-10-CM | POA: Insufficient documentation

## 2013-09-16 MED ORDER — CYCLOBENZAPRINE HCL 10 MG PO TABS: 10.0000 mg | ORAL_TABLET | Freq: Two times a day (BID) | ORAL | Status: DC | PRN

## 2013-09-16 MED ORDER — NAPROXEN 500 MG PO TABS: 500.0000 mg | ORAL_TABLET | Freq: Two times a day (BID) | ORAL | Status: DC
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
## 2013-09-17 NOTE — ED Provider Notes (Signed)
Medical screening examination/treatment/procedure(s) were performed by non-physician practitioner and as supervising physician I was immediately available for consultation/collaboration.   EKG Interpretation None       Courtney F Horton, MD 09/17/13 1327 

## 2016-01-31 ENCOUNTER — Encounter (HOSPITAL_BASED_OUTPATIENT_CLINIC_OR_DEPARTMENT_OTHER): Payer: Self-pay | Admitting: Emergency Medicine

## 2016-01-31 ENCOUNTER — Emergency Department (HOSPITAL_BASED_OUTPATIENT_CLINIC_OR_DEPARTMENT_OTHER)
Admission: EM | Admit: 2016-01-31 | Discharge: 2016-01-31 | Disposition: A | Discharge status: Home / Self Care | Payer: Managed Care, Other (non HMO) | Attending: Emergency Medicine | Admitting: Emergency Medicine

## 2016-01-31 ENCOUNTER — Emergency Department (HOSPITAL_BASED_OUTPATIENT_CLINIC_OR_DEPARTMENT_OTHER): Payer: Managed Care, Other (non HMO)

## 2016-01-31 DIAGNOSIS — G5622 Lesion of ulnar nerve, left upper limb: Secondary | ICD-10-CM | POA: Diagnosis not present

## 2016-01-31 DIAGNOSIS — M25522 Pain in left elbow: Secondary | ICD-10-CM | POA: Diagnosis present

## 2016-01-31 NOTE — Discharge Instructions (Signed)
Use anti-inflammatory such as Aleve and/or  ibuprofen for ear pain. Wrap a towel around your elbow at night to help prevent bending your elbow. Follow-up with the orthopedic specialist provided. Return if he developed weakness in your arm

## 2016-01-31 NOTE — ED Provider Notes (Signed)
[facility] Provider Note   CSN: [id number] Arrival date & time: 01/31/16  1957 By signing my name below, I, [name], attest that this documentation has been prepared under the direction and in the presence of Pricilla LovelessScott Stacy Sailer, MD. Electronically Signed: [name], ED Scribe. 01/31/16. 9:31 PM.  History   Chief Complaint Chief Complaint  Patient presents with  . Arm Pain   HPI Comments: Mitchell Brooks is a 40 y.o. male who presents to the Emergency Department complaining of constant, 7/10 left elbow pain onset 3 days ago. Pt also has associated mild numbness and paresthesia in his ring and pinky finger. He denies any injury to the area. Pt states pain is exacerbated with bending his arm. Per pt, he drives a forklift at work and that may be why he is experiencing these factors. No alleviating factors noted. Pt denies fever.  The history is provided by the patient. No language interpreter was used.    Past Medical History:  Diagnosis Date  . Back pain     There are no active problems to display for this patient.   Past Surgical History:  Procedure Laterality Date  . EYE SURGERY         Home Medications    Prior to Admission medications   Not on File    Family History No family history on file.  Social History Social History  Substance Use Topics  . Smoking status: Never Smoker  . Smokeless tobacco: Never Used  . Alcohol use No     Allergies   Review of patient's allergies indicates no known allergies.   Review of Systems Review of Systems  Constitutional: Negative for fever.  Musculoskeletal: Positive for arthralgias.  Neurological: Positive for numbness.  All other systems reviewed and are negative.    Physical Exam Updated Vital Signs BP (!) 166/124   Pulse 98   Temp 98 F (36.7 C) (Oral)   Resp 18   Ht 6\' 1"  (1.854 m)   Wt 260 lb (117.9 kg)   SpO2 95%   BMI 34.30 kg/m   Physical Exam  Constitutional: He is oriented to person,  place, and time. He appears well-developed and well-nourished.  HENT:  Head: Normocephalic and atraumatic.  Right Ear: External ear normal.  Left Ear: External ear normal.  Nose: Nose normal.  Eyes: Right eye exhibits no discharge. Left eye exhibits no discharge.  Neck: Neck supple.  Cardiovascular: Normal rate, regular rhythm, normal heart sounds and intact distal pulses.   Pulses:      Radial pulses are 2+ on the left side.  Pulmonary/Chest: Effort normal and breath sounds normal.  Abdominal: Soft. There is no tenderness.  Musculoskeletal: He exhibits no edema.       Left elbow: He exhibits normal range of motion and no swelling. Tenderness (Over ulnar groove) found.  Normal movement and strength in LUE. No muscle wasting in LUE. Normal motor function of radial, ulnar, and median nerves. Subjective decreased sensation over pinky finger, ulnar ring finger, and ulnar hand.   Neurological: He is alert and oriented to person, place, and time.  Skin: Skin is warm and dry.  Nursing note and vitals reviewed.    ED Treatments / Results  DIAGNOSTIC STUDIES: Oxygen Saturation is 95% on RA, poor by my interpretation.    COORDINATION OF CARE: 9:29 PM Discussed treatment plan with pt at bedside which includes x-ray and pt agreed to plan.  Labs (all labs ordered are listed, but only abnormal  results are displayed) Labs Reviewed - No data to display  EKG  EKG Interpretation None       Radiology Dg Elbow Complete Left  Result Date: 01/31/2016 CLINICAL DATA:  40 year old male with left elbow pain for 4 days with no known injury. Initial encounter. EXAM: LEFT ELBOW - COMPLETE 3+ VIEW COMPARISON:  None. FINDINGS: Bone mineralization is within normal limits. Joint spaces and alignment are preserved. Difficult to exclude a small joint effusion on the lateral view, but no fracture, dislocation, or acute osseous abnormality is identified. IMPRESSION: Difficult to exclude a small joint effusion  but no osseous abnormality is identified about the left elbow. Electronically Signed   By: [name] M.D.   On: 01/31/2016 21:21    Procedures Procedures (including critical care time)  Medications Ordered in ED Medications - No data to display   Initial Impression / Assessment and Plan / ED Course  I have reviewed the triage vital signs and the nursing notes.  Pertinent labs & imaging results that were available during my care of the patient were reviewed by me and considered in my medical decision making (see chart for details).  Clinical Course    Patient does not appear to have an infected elbow. There is clinically no joint effusion and he has normal range of motion although with some pain that is reproduced into his fingers. Appears to have ulnar entrapment. Appears to be in a mild stage at this time. No muscle wasting or weakness. Will treat conservatively with continued NSAIDs, recommended wrapping his elbow in a towel to help prevent flexion at night and I have discussed that he needs to stop activities that reproduces symptoms. Patient understands. Refer to orthopedics, hand surgeon. Discussed return precautions.  Final Clinical Impressions(s) / ED Diagnoses   Final diagnoses:  Ulnar nerve entrapment at elbow, left    New Prescriptions There are no discharge medications for this patient.  I personally performed the services described in this documentation, which was scribed in my presence. The recorded information has been reviewed and is accurate.     Pricilla Loveless, MD 02/01/16 0005

## 2016-01-31 NOTE — ED Triage Notes (Signed)
Pt states Thursday at work his left arm at elbow just started hurting

## 2016-01-31 NOTE — ED Notes (Signed)
Pt states he noticed his left elbow hurting on Thursday. No known injury, but uses repetitive movement at work involving same.

## 2017-05-23 ENCOUNTER — Other Ambulatory Visit: Payer: Self-pay

## 2017-05-23 ENCOUNTER — Emergency Department (HOSPITAL_BASED_OUTPATIENT_CLINIC_OR_DEPARTMENT_OTHER)
Admission: EM | Admit: 2017-05-23 | Discharge: 2017-05-24 | Disposition: A | Discharge status: Home / Self Care | Payer: Managed Care, Other (non HMO) | Attending: Emergency Medicine | Admitting: Emergency Medicine

## 2017-05-23 ENCOUNTER — Encounter (HOSPITAL_BASED_OUTPATIENT_CLINIC_OR_DEPARTMENT_OTHER): Payer: Self-pay | Admitting: Adult Health

## 2017-05-23 DIAGNOSIS — M25511 Pain in right shoulder: Secondary | ICD-10-CM | POA: Diagnosis not present

## 2017-05-23 HISTORY — DX: Essential (primary) hypertension: I10

## 2017-05-23 NOTE — ED Provider Notes (Signed)
[facility] EMERGENCY DEPARTMENT Provider Note   CSN: [id number] Arrival date & time: 05/23/17  2348     History   Chief Complaint No chief complaint on file.   HPI Mitchell Brooks is a 41 y.o. male.  HPI  41 y.o. male, presents to the Emergency Department today due to right shoulder pain. States this occurred yesterday with overuse of arm fixing trailer. Noted mild pain until he worked more on the trailer today. States that he heard a pop in his shoulder and has been having trouble moving since. Minimal pain at rest. Notes worse pain with ROM. Pt states he is able to have full ROM, but hurts a lot. Denies numbness. No CP/SOB. No N/V. No diaphoresis. Motrin PRN with some relief. Pt also notes that he did not take his BP medications this evening. No other symptoms noted    Past Medical History:  Diagnosis Date  . Back pain     There are no active problems to display for this patient.   Past Surgical History:  Procedure Laterality Date  . EYE SURGERY         Home Medications    Prior to Admission medications   Not on File    Family History No family history on file.  Social History Social History   Tobacco Use  . Smoking status: Never Smoker  . Smokeless tobacco: Never Used  Substance Use Topics  . Alcohol use: No  . Drug use: No     Allergies   Patient has no known allergies.   Review of Systems Review of Systems ROS reviewed and all are negative for acute change except as noted in the HPI.  Physical Exam Updated Vital Signs Pulse 97   Temp 97.8 F (36.6 C) (Oral)   Resp 18   SpO2 99%   Physical Exam  Constitutional: He is oriented to person, place, and time. He appears well-developed and well-nourished. No distress.  HENT:  Head: Normocephalic and atraumatic.  Right Ear: Tympanic membrane, external ear and ear canal normal.  Left Ear: Tympanic membrane, external ear and ear canal normal.  Nose: Nose normal.  Mouth/Throat: Uvula  is midline, oropharynx is clear and moist and mucous membranes are normal. No trismus in the jaw. No oropharyngeal exudate, posterior oropharyngeal erythema or tonsillar abscesses.  Eyes: EOM are normal. Pupils are equal, round, and reactive to light.  Neck: Normal range of motion. Neck supple. No tracheal deviation present.  Cardiovascular: Normal rate, regular rhythm, S1 normal, S2 normal, normal heart sounds, intact distal pulses and normal pulses.  Pulmonary/Chest: Effort normal and breath sounds normal. No respiratory distress. He has no decreased breath sounds. He has no wheezes. He has no rhonchi. He has no rales.  Abdominal: Normal appearance and bowel sounds are normal. There is no tenderness.  Musculoskeletal: Normal range of motion.  Right shoulder without obvious deformity. TTP posterior aspect along shoulder joint along rotator cuff. No swelling, but point tender. Active ROM  Limited due to pain. Passive ROM intact with mild discomfort. NVI. Distal pulses appreciated   Neurological: He is alert and oriented to person, place, and time.  Skin: Skin is warm and dry.  Psychiatric: He has a normal mood and affect. His speech is normal and behavior is normal. Thought content normal.  Nursing note and vitals reviewed.   ED Treatments / Results  Labs (all labs ordered are listed, but only abnormal results are displayed) Labs Reviewed - No data to display  EKG  EKG Interpretation None       Radiology No results found.  Procedures Procedures (including critical care time)  Medications Ordered in ED Medications - No data to display   Initial Impression / Assessment and Plan / ED Course  I have reviewed the triage vital signs and the nursing notes.  Pertinent labs & imaging results that were available during my care of the patient were reviewed by me and considered in my medical decision making (see chart for details).  Final Clinical Impressions(s) / ED Diagnoses     {I  have reviewed the relevant previous healthcare records.  {I obtained HPI from historian.   ED Course:  Assessment: Pt is a 41 y.o. male presents to the Emergency Department today due to right shoulder pain. States this occurred yesterday with overuse of arm fixing trailer. Noted mild pain until he worked more on the trailer today. States that he heard a pop in his shoulder and has been having trouble moving since. Minimal pain at rest. Notes worse pain with ROM. Pt states he is able to have full ROM, but hurts a lot. Denies numbness. No CP/SOB. No N/V. No diaphoresis. Motrin PRN with some relief. On exam, pt in NAD. Nontoxic/nonseptic appearing. VSS. Afebrile. Lungs CTA. Heart RRR. Right shoulder without obvious deformity. TTP posterior aspect along shoulder joint along rotator cuff. No swelling, but point tender. Active ROM  Limited due to pain. Passive ROM intact with mild discomfort. NVI. Distal pulses appreciated. I suspect this is a rotator cuff tear. Doubt dislocation given ROM. Doubt fracture given no direct trauma. Given sling and percocet. Will Rx percocet to assist with pain. I have reviewed the [place] Controlled Substance Reporting System. Pt also noted to be hypertensive in ED. Forgot to take home BP meds this evening. Counseled to take when he goes home. Plan is to DC home with follow up to Ortho. At time of discharge, Patient is in no acute distress. Vital Signs are stable. Patient is able to ambulate. Patient able to tolerate PO.   Disposition/Plan:  DC Home Additional Verbal discharge instructions given and discussed with patient.  Pt Instructed to f/u with Ortho in the next week for evaluation and treatment of symptoms. Return precautions given Pt acknowledges and agrees with plan  Supervising Physician Derwood KaplanNanavati, Ankit, MD  Final diagnoses:  Acute pain of right shoulder    ED Discharge Orders    None         Audry PiliMohr, Shadaya Marschner, PA-C 05/24/17 0008    Derwood KaplanNanavati, Ankit,  MD 05/24/17 [phone]

## 2017-05-23 NOTE — ED Triage Notes (Signed)
PResents with right shoulder pain that began Monday after working on his camper at home. The pain is severe in the back of the shoulder. He has tried tramadol and gabapentin without relief.

## 2017-05-24 MED ORDER — IBUPROFEN 600 MG PO TABS: 600.0000 mg | ORAL_TABLET | Freq: Four times a day (QID) | ORAL | 0 refills | Status: AC | PRN

## 2017-05-24 MED ORDER — OXYCODONE-ACETAMINOPHEN 5-325 MG PO TABS: 1.0000 | ORAL_TABLET | Freq: Four times a day (QID) | ORAL | 0 refills | Status: DC | PRN

## 2017-05-24 MED ORDER — OXYCODONE-ACETAMINOPHEN 5-325 MG PO TABS
1.0000 | ORAL_TABLET | Freq: Once | ORAL | Status: AC
  Administered 2017-05-24: 1 via ORAL
  Filled 2017-05-24: qty 1

## 2017-05-24 NOTE — Discharge Instructions (Addendum)
Please read and follow all provided instructions.  Your diagnoses today include:  1. Acute pain of right shoulder     Tests performed today include: Vital signs. See below for your results today.   Medications prescribed:  Take as prescribed   Home care instructions:  Follow any educational materials contained in this packet.  Follow-up instructions: Please follow-up with Orthopedics for further evaluation of symptoms and treatment   Return instructions:  Please return to the Emergency Department if you do not get better, if you get worse, or new symptoms OR  - Fever (temperature greater than 101.45F)  - Bleeding that does not stop with holding pressure to the area    -Severe pain (please note that you may be more sore the day after your accident)  - Chest Pain  - Difficulty breathing  - Severe nausea or vomiting  - Inability to tolerate food and liquids  - Passing out  - Skin becoming red around your wounds  - Change in mental status (confusion or lethargy)  - New numbness or weakness    Please return if you have any other emergent concerns.

## 2017-05-24 NOTE — ED Notes (Signed)
ED Provider at bedside. 

## 2018-07-25 ENCOUNTER — Encounter (HOSPITAL_BASED_OUTPATIENT_CLINIC_OR_DEPARTMENT_OTHER): Payer: Self-pay

## 2018-07-25 ENCOUNTER — Emergency Department (HOSPITAL_BASED_OUTPATIENT_CLINIC_OR_DEPARTMENT_OTHER)
Admission: EM | Admit: 2018-07-25 | Discharge: 2018-07-25 | Disposition: A | Discharge status: Home / Self Care | Payer: Managed Care, Other (non HMO) | Attending: Emergency Medicine | Admitting: Emergency Medicine

## 2018-07-25 ENCOUNTER — Other Ambulatory Visit: Payer: Self-pay

## 2018-07-25 ENCOUNTER — Emergency Department (HOSPITAL_BASED_OUTPATIENT_CLINIC_OR_DEPARTMENT_OTHER): Payer: Managed Care, Other (non HMO)

## 2018-07-25 DIAGNOSIS — M79671 Pain in right foot: Secondary | ICD-10-CM | POA: Insufficient documentation

## 2018-07-25 DIAGNOSIS — I10 Essential (primary) hypertension: Secondary | ICD-10-CM

## 2018-07-25 DIAGNOSIS — Z79899 Other long term (current) drug therapy: Secondary | ICD-10-CM | POA: Diagnosis not present

## 2018-07-25 MED ORDER — OXYCODONE-ACETAMINOPHEN 5-325 MG PO TABS
1.0000 | ORAL_TABLET | Freq: Once | ORAL | Status: AC
  Administered 2018-07-25: 1 via ORAL
  Filled 2018-07-25: qty 1

## 2018-07-25 MED ORDER — ACETAMINOPHEN 325 MG PO TABS
650.0000 mg | ORAL_TABLET | Freq: Once | ORAL | Status: AC
  Administered 2018-07-25: 650 mg via ORAL
  Filled 2018-07-25: qty 2

## 2018-07-25 MED ORDER — INDOMETHACIN 50 MG PO CAPS: 50.0000 mg | ORAL_CAPSULE | Freq: Three times a day (TID) | ORAL | 0 refills | Status: AC

## 2018-07-25 NOTE — Discharge Instructions (Signed)
While we initially talked about colchicine, that is most effective if started in the first 24 hours therefore I have instead given you a prescription for indomethacin.  Please take this only as needed, and once your symptoms improve please stop taking it. Do not take any ibuprofen, naproxen, Aleve, or Motrin while taking indomethacin.   We also discussed today that your blood pressure is elevated, please follow-up with your primary care doctor. We discussed that x-rays are not 100% and you may have stress fracture or other cause for your pain.  If your symptoms worsen or you have additional concerns please seek additional medical care and evaluation.

## 2018-07-25 NOTE — ED Notes (Signed)
Patient transported to X-ray 

## 2018-07-25 NOTE — ED Triage Notes (Signed)
C/o pain/swelling to right foot x 6 days-denies recent injury-hx of fx/no surgery 2016-NAD-steady gait

## 2018-07-25 NOTE — ED Provider Notes (Signed)
[facility] EMERGENCY DEPARTMENT Provider Note   CSN: [id number] Arrival date & time: 07/25/18  2016    History   Chief Complaint Chief Complaint  Patient presents with  . Foot Pain    HPI Mitchell Brooks is a 43 y.o. male with a past medical history of hypertension who presents today for evaluation of 6 days of right foot pain and swelling.  He denies any recent injury or changes in activity.  He did break this foot back in 2016.  He denies any recent fevers.  He reports that it was swollen however has been getting better.  He has been trying rest ibuprofen and Tylenol without full significant relief.  He has never had gout before.      HPI  Past Medical History:  Diagnosis Date  . Back pain   . Hypertension     There are no active problems to display for this patient.   Past Surgical History:  Procedure Laterality Date  . EYE SURGERY          Home Medications    Prior to Admission medications   Medication Sig Start Date End Date Taking? Authorizing Provider  gabapentin (NEURONTIN) 300 MG capsule Take 300 mg by mouth 3 (three) times daily.    [provider]  hydrochlorothiazide (HYDRODIURIL) 25 MG tablet Take 25 mg by mouth daily.    [provider]  ibuprofen (ADVIL,MOTRIN) 600 MG tablet Take 1 tablet (600 mg total) by mouth every 6 (six) hours as needed. 05/24/17   Audry Pili, PA-C  indomethacin (INDOCIN) 50 MG capsule Take 1 capsule (50 mg total) by mouth 3 (three) times daily with meals for 5 days. 07/25/18 07/30/18  Cristina Gong, PA-C  oxyCODONE-acetaminophen (PERCOCET/ROXICET) 5-325 MG tablet Take 1 tablet by mouth every 6 (six) hours as needed for severe pain. 05/24/17   Audry Pili, PA-C    Family History No family history on file.  Social History Social History   Tobacco Use  . Smoking status: Never Smoker  . Smokeless tobacco: Never Used  Substance Use Topics  . Alcohol use: No  . Drug use: No     Allergies     Patient has no known allergies.   Review of Systems Review of Systems  Constitutional: Negative for chills, fatigue and fever.  Musculoskeletal: Negative for back pain and neck pain.       Right foot pain  All other systems reviewed and are negative.    Physical Exam Updated Vital Signs BP (!) 167/138 (BP Location: Right Arm)   Pulse 100   Temp 98.1 F (36.7 C) (Oral)   Resp 16   Ht 6\' 1"  (1.854 m)   Wt 111.1 kg   SpO2 99%   BMI 32.32 kg/m   Physical Exam Vitals signs and nursing note reviewed.  Constitutional:      General: He is not in acute distress. HENT:     Head: Normocephalic and atraumatic.  Cardiovascular:     Rate and Rhythm: Normal rate.     Pulses: Normal pulses.     Comments: Right foot 2+ DP/PT pulses. Musculoskeletal:     Comments: There is diffuse tenderness to palpation of the foot, primarily over the distal fourth and fifth metatarsals.  There is no crepitus or deformities palpated.  There is no palpable Morton's neuroma.  Skin:    Comments: No wounds visible on right foot.  Right nails are thickened and dystrophic.  There is no abnormal erythema,  ecchymosis or induration.    Neurological:     Mental Status: He is alert.     Comments: Sensation intact to right foot.      ED Treatments / Results  Labs (all labs ordered are listed, but only abnormal results are displayed) Labs Reviewed - No data to display  EKG None  Radiology Dg Foot Complete Right  Result Date: 07/25/2018 CLINICAL DATA:  Pain and swelling in right foot over 3rd through 5th metatarsals EXAM: RIGHT FOOT COMPLETE - 3+ VIEW COMPARISON:  12/07/2016 FINDINGS: There is no evidence of fracture or dislocation. There is no evidence of arthropathy or other focal bone abnormality. Soft tissues are unremarkable. IMPRESSION: Negative. Electronically Signed   By: Charlett Nose M.D.   On: 07/25/2018 21:29    Procedures Procedures (including critical care time)  Medications Ordered in  ED Medications  acetaminophen (TYLENOL) tablet 650 mg (650 mg Oral Given 07/25/18 2051)  oxyCODONE-acetaminophen (PERCOCET/ROXICET) 5-325 MG per tablet 1 tablet (1 tablet Oral Given 07/25/18 2232)     Initial Impression / Assessment and Plan / ED Course  I have reviewed the triage vital signs and the nursing notes.  Pertinent labs & imaging results that were available during my care of the patient were reviewed by me and considered in my medical decision making (see chart for details).        Pt is afebrile and stable. Imaging reviewed, no evidence of occult fracture or injury.  Most recent labs were at the end of last year, show normal GFR . pt without known peptic ulcer disease and not receiving concurrent treatment on warfarin. Pt dc with indomethacin (50 mg PO TID). Discussed that pt should respond to treatment with in 24 hour of begining treatment & likely resolve in 2-3 days.   Return precautions were discussed with patient who states their understanding.  At the time of discharge patient denied any unaddressed complaints or concerns.  Patient is agreeable for discharge home.   Final Clinical Impressions(s) / ED Diagnoses   Final diagnoses:  Foot pain, right  Hypertension, unspecified type    ED Discharge Orders         Ordered    indomethacin (INDOCIN) 50 MG capsule  3 times daily with meals     07/25/18 2233           Cristina Gong, PA-C 07/26/18 Ivor Reining    Geoffery Lyons, MD 07/28/18 1108

## 2018-07-25 NOTE — ED Notes (Signed)
Pt verbalizes understanding of d/c instructions and denies any further needs at this time. 

## 2019-03-24 ENCOUNTER — Emergency Department (HOSPITAL_BASED_OUTPATIENT_CLINIC_OR_DEPARTMENT_OTHER): Payer: Self-pay

## 2019-03-24 ENCOUNTER — Other Ambulatory Visit: Payer: Self-pay

## 2019-03-24 ENCOUNTER — Encounter (HOSPITAL_BASED_OUTPATIENT_CLINIC_OR_DEPARTMENT_OTHER): Payer: Self-pay | Admitting: Emergency Medicine

## 2019-03-24 ENCOUNTER — Emergency Department (HOSPITAL_BASED_OUTPATIENT_CLINIC_OR_DEPARTMENT_OTHER)
Admission: EM | Admit: 2019-03-24 | Discharge: 2019-03-24 | Disposition: A | Discharge status: Home / Self Care | Payer: Self-pay | Attending: Emergency Medicine | Admitting: Emergency Medicine

## 2019-03-24 DIAGNOSIS — Y999 Unspecified external cause status: Secondary | ICD-10-CM | POA: Insufficient documentation

## 2019-03-24 DIAGNOSIS — I10 Essential (primary) hypertension: Secondary | ICD-10-CM | POA: Insufficient documentation

## 2019-03-24 DIAGNOSIS — S22008A Other fracture of unspecified thoracic vertebra, initial encounter for closed fracture: Secondary | ICD-10-CM | POA: Insufficient documentation

## 2019-03-24 DIAGNOSIS — Y9389 Activity, other specified: Secondary | ICD-10-CM | POA: Insufficient documentation

## 2019-03-24 DIAGNOSIS — Y9289 Other specified places as the place of occurrence of the external cause: Secondary | ICD-10-CM | POA: Insufficient documentation

## 2019-03-24 DIAGNOSIS — X58XXXA Exposure to other specified factors, initial encounter: Secondary | ICD-10-CM | POA: Insufficient documentation

## 2019-03-24 DIAGNOSIS — M542 Cervicalgia: Secondary | ICD-10-CM | POA: Insufficient documentation

## 2019-03-24 DIAGNOSIS — R079 Chest pain, unspecified: Secondary | ICD-10-CM | POA: Insufficient documentation

## 2019-03-24 LAB — CBC WITH DIFFERENTIAL/PLATELET
Abs Immature Granulocytes: 0.03 10*3/uL (ref 0.00–0.07)
Basophils Absolute: 0 10*3/uL (ref 0.0–0.1)
Basophils Relative: 0 %
Eosinophils Absolute: 0.1 10*3/uL (ref 0.0–0.5)
Eosinophils Relative: 1 %
HCT: 44.1 % (ref 39.0–52.0)
Hemoglobin: 14.5 g/dL (ref 13.0–17.0)
Immature Granulocytes: 0 %
Lymphocytes Relative: 23 %
Lymphs Abs: 2.1 10*3/uL (ref 0.7–4.0)
MCH: 28.4 pg (ref 26.0–34.0)
MCHC: 32.9 g/dL (ref 30.0–36.0)
MCV: 86.3 fL (ref 80.0–100.0)
Monocytes Absolute: 0.6 10*3/uL (ref 0.1–1.0)
Monocytes Relative: 7 %
Neutro Abs: 6.4 10*3/uL (ref 1.7–7.7)
Neutrophils Relative %: 69 %
Platelets: 201 10*3/uL (ref 150–400)
RBC: 5.11 MIL/uL (ref 4.22–5.81)
RDW: 12.7 % (ref 11.5–15.5)
WBC: 9.3 10*3/uL (ref 4.0–10.5)
nRBC: 0 % (ref 0.0–0.2)

## 2019-03-24 LAB — COMPREHENSIVE METABOLIC PANEL
ALT: 36 U/L (ref 0–44)
AST: 23 U/L (ref 15–41)
Albumin: 4.5 g/dL (ref 3.5–5.0)
Alkaline Phosphatase: 17 U/L — ABNORMAL LOW (ref 38–126)
Anion gap: 8 (ref 5–15)
BUN: 8 mg/dL (ref 6–20)
CO2: 23 mmol/L (ref 22–32)
Calcium: 9.6 mg/dL (ref 8.9–10.3)
Chloride: 106 mmol/L (ref 98–111)
Creatinine, Ser: 1.05 mg/dL (ref 0.61–1.24)
GFR calc Af Amer: >60 mL/min (ref >60)
GFR calc non Af Amer: >60 mL/min (ref >60)
Glucose, Bld: 109 mg/dL — ABNORMAL HIGH (ref 70–99)
Potassium: 3.7 mmol/L (ref 3.5–5.1)
Sodium: 137 mmol/L (ref 135–145)
Total Bilirubin: 0.8 mg/dL (ref 0.3–1.2)
Total Protein: 7.7 g/dL (ref 6.5–8.1)

## 2019-03-24 LAB — TROPONIN I (HIGH SENSITIVITY)
Troponin I (High Sensitivity): 3 ng/L (ref <18)
Troponin I (High Sensitivity): 4 ng/L (ref <18)

## 2019-03-24 MED ORDER — OXYCODONE-ACETAMINOPHEN 5-325 MG PO TABS: 1.0000 | ORAL_TABLET | Freq: Four times a day (QID) | ORAL | 0 refills | Status: AC | PRN

## 2019-03-24 MED ORDER — DICLOFENAC SODIUM 1 % TD GEL: 2.0000 g | Freq: Four times a day (QID) | TRANSDERMAL | 0 refills | Status: AC | PRN

## 2019-03-24 MED ORDER — ONDANSETRON HCL 4 MG/2ML IJ SOLN
4.0000 mg | Freq: Once | INTRAMUSCULAR | Status: AC
  Administered 2019-03-24: 4 mg via INTRAVENOUS
  Filled 2019-03-24: qty 2

## 2019-03-24 MED ORDER — OXYCODONE-ACETAMINOPHEN 5-325 MG PO TABS
1.0000 | ORAL_TABLET | Freq: Once | ORAL | Status: AC
  Administered 2019-03-24: 1 via ORAL
  Filled 2019-03-24: qty 1

## 2019-03-24 MED ORDER — SODIUM CHLORIDE 0.9 % IV BOLUS
500.0000 mL | Freq: Once | INTRAVENOUS | Status: AC
  Administered 2019-03-24: 500 mL via INTRAVENOUS

## 2019-03-24 MED ORDER — MORPHINE SULFATE (PF) 4 MG/ML IV SOLN
4.0000 mg | Freq: Once | INTRAVENOUS | Status: AC
  Administered 2019-03-24: 4 mg via INTRAVENOUS
  Filled 2019-03-24: qty 1

## 2019-03-24 MED ORDER — IOHEXOL 350 MG/ML SOLN
100.0000 mL | Freq: Once | INTRAVENOUS | Status: AC | PRN
  Administered 2019-03-24: 100 mL via INTRAVENOUS

## 2019-03-24 MED ORDER — OXYCODONE-ACETAMINOPHEN 5-325 MG PO TABS: 2.0000 | ORAL_TABLET | ORAL | 0 refills | Status: DC | PRN

## 2019-03-24 NOTE — ED Triage Notes (Signed)
Thoracic back pain radiating into his neck while Friday while working. He felt something pop when he got out of the truck.

## 2019-03-24 NOTE — ED Notes (Signed)
ED Provider at bedside. 

## 2019-03-24 NOTE — ED Notes (Signed)
Patient transported to CT 

## 2019-03-24 NOTE — ED Notes (Signed)
Pt reports taking tramadol at 0900 today, took aleve at 0400 with no relief.

## 2019-03-24 NOTE — ED Provider Notes (Signed)
Emergency Department Provider Note   I have reviewed the triage vital signs and the nursing notes.   HISTORY  Chief Complaint Back Pain   HPI Mitchell Brooks is a 43 y.o. male with PMH of HTN presents to the emergency department for evaluation of acute onset mid thoracic back pain now radiating to his right neck.  Symptoms began 2 days ago while getting out of his truck.  He states he felt a "pop" in his back with radiation of pain into the upper back.  Denies chest pain or shortness of breath.  No numbness or weakness.  He noted yesterday the pain was starting to radiate into his right lateral neck.  Denies dizziness or vision change.  Continued over-the-counter medication, heat application, with no relief in symptoms.  He presents today for evaluation.  He does take blood pressure medication but denies smoking history.  Pain is severe and somewhat worse with movement.    Past Medical History:  Diagnosis Date   Back pain    Hypertension     There are no active problems to display for this patient.   Past Surgical History:  Procedure Laterality Date   EYE SURGERY      Allergies Patient has no known allergies.  No family history on file.  Social History Social History   Tobacco Use   Smoking status: Never Smoker   Smokeless tobacco: Never Used  Substance Use Topics   Alcohol use: No   Drug use: No    Review of Systems  Constitutional: No fever/chills Eyes: No visual changes. ENT: No sore throat. Cardiovascular: Denies chest pain. Respiratory: Denies shortness of breath. Gastrointestinal: No abdominal pain.  No nausea, no vomiting.  No diarrhea.  No constipation. Genitourinary: Negative for dysuria. Musculoskeletal: Positive midthoracic back pain radiating to the neck.  Skin: Negative for rash. Neurological: Negative for headaches, focal weakness or numbness.  10-point ROS otherwise  negative.  ____________________________________________   PHYSICAL EXAM:  VITAL SIGNS: ED Triage Vitals  Enc Vitals Group     BP 03/24/19 1509 (!) 198/111     Pulse Rate 03/24/19 1509 96     Resp 03/24/19 1509 20     Temp 03/24/19 1509 98.6 F (37 C)     Temp Source 03/24/19 1509 Oral     SpO2 03/24/19 1509 99 %     Weight 03/24/19 1507 255 lb (115.7 kg)     Height 03/24/19 1507 6\' 1"  (1.854 m)   Constitutional: Alert and oriented. Well appearing and in no acute distress. Eyes: Conjunctivae are normal.  Head: Atraumatic. Nose: No congestion/rhinnorhea. Mouth/Throat: Mucous membranes are moist.  Neck: No stridor.   Cardiovascular: Normal rate, regular rhythm. Good peripheral circulation. Grossly normal heart sounds.   Respiratory: Normal respiratory effort.  No retractions. Lungs CTAB. Gastrointestinal: Soft and nontender. No distention.  Musculoskeletal: Mid thoracic tenderness diffusely with no midline tenderness.  Neurologic:  Normal speech and language.  Skin:  Skin is warm, dry and intact. No rash noted.  ____________________________________________   LABS (all labs ordered are listed, but only abnormal results are displayed)  Labs Reviewed  COMPREHENSIVE METABOLIC PANEL - Abnormal; Notable for the following components:      Result Value   Glucose, Bld 109 (*)    Alkaline Phosphatase 17 (*)    All other components within normal limits  CBC WITH DIFFERENTIAL/PLATELET  TROPONIN I (HIGH SENSITIVITY)  TROPONIN I (HIGH SENSITIVITY)   ____________________________________________  EKG  Sinus rhythm, Normal axis, Narrow  QRS. No ST elevation or depression. No STEMI  PR: 168 QTc: 469 ____________________________________________  RADIOLOGY  Dg Chest 2 View  Result Date: 03/24/2019 CLINICAL DATA:  Posterior chest wall, back pain EXAM: CHEST - 2 VIEW COMPARISON:  11/13/2010 FINDINGS: The heart size and mediastinal contours are within normal limits. Both lungs are  clear. Mild multilevel disc space height loss and osteophytosis. IMPRESSION: No acute abnormality of the lungs. Mild multilevel disc space height loss and osteophytosis of the thoracic spine. Electronically Signed   By: Lauralyn PrimesAlex  Bibbey M.D.   On: 03/24/2019 17:06   Ct Angio Chest/abd/pel For Dissection W And/or Wo Contrast  Result Date: 03/24/2019 CLINICAL DATA:  Chest pain between the shoulder blades, felt pop while exiting truck, denies shortness of breath or abdominal pain EXAM: CT ANGIOGRAPHY CHEST, ABDOMEN AND PELVIS TECHNIQUE: Multidetector CT imaging through the chest, abdomen and pelvis was performed using the standard protocol during bolus administration of intravenous contrast. Multiplanar reconstructed images and MIPs were obtained and reviewed to evaluate the vascular anatomy. CONTRAST:  100mL OMNIPAQUE IOHEXOL 350 MG/ML SOLN COMPARISON:  CTA chest 04/27/2018 FINDINGS: CTA CHEST FINDINGS Cardiovascular: Non-contrast CT evaluation reveals no abnormal hyperdense mural thickening to suggest intramural hematoma. Postcontrast arterial phase imaging preferentially opacifies the thoracic aorta. The aortic root is suboptimally assessed given cardiac pulsation artifact. The aorta is normal caliber. No intramural hematoma, dissection flap or other acute luminal abnormality of the aorta is seen. No periaortic stranding or hemorrhage. Normal 3 vessel arch. Proximal great vessels are unremarkable. Normal heart size. No pericardial effusion. Central pulmonary arteries are normal caliber. No central or lobar filling defects on this non tailored examination of the pulmonary arteries. Mediastinum/Nodes: Mild fatty stippling of the anterior mediastinum, stable from prior and may reflect thymic remnant. Thyroid gland and thoracic inlet are normal. No acute abnormality of the trachea or esophagus. No mediastinal, hilar or axillary adenopathy. Lungs/Pleura: Bandlike atelectasis or scarring in the left lower lobe. Lungs are  otherwise clear. No consolidation, features of edema, pneumothorax, or effusion. No suspicious pulmonary nodules or masses. Musculoskeletal: Question a nondisplaced fracture at the tip of the spinous process of T5 (sagittal 8/95) no other acute osseous abnormality. No suspicious osseous lesions. Multilevel degenerative changes are present in the imaged portions of the spine. Review of the MIP images confirms the above findings. CTA ABDOMEN AND PELVIS FINDINGS VASCULAR Aorta: Normal caliber aorta without aneurysm, dissection, vasculitis or significant stenosis. Celiac: Patent without evidence of aneurysm, dissection, vasculitis or significant stenosis. SMA: Patent without evidence of aneurysm, dissection, vasculitis or significant stenosis. Renals: Single renal arteries bilaterally. Both are patent without aneurysm, dissection, features of vasculitis or fibromuscular dysplasia. IMA: Patent without evidence of aneurysm, dissection, vasculitis or significant stenosis. Inflow: Patent without evidence of aneurysm, dissection, vasculitis or significant stenosis. Veins: Major venous structures are unremarkable within the limitations of this arterial phase exam Review of the MIP images confirms the above findings. NON-VASCULAR Hepatobiliary: No focal liver abnormality is seen. No gallstones, gallbladder wall thickening, or biliary dilatation. Pancreas: Unremarkable. No pancreatic ductal dilatation or surrounding inflammatory changes. Spleen: Normal in size without focal abnormality. Adrenals/Urinary Tract: Normal adrenal glands. 12 mm partially exophytic upper pole left renal cyst has a simple appearance, unchanged from comparison. Kidneys are otherwise unremarkable, without renal calculi, suspicious lesion, or hydronephrosis. Bladder is unremarkable. Stomach/Bowel: Distal esophagus, stomach and duodenal sweep are unremarkable. No small bowel wall thickening or dilatation. No evidence of obstruction. A normal appendix is  visualized. No colonic dilatation or wall  thickening. Lymphatic: No suspicious or enlarged lymph nodes in the included lymphatic chains. Reproductive: The prostate and seminal vesicles are unremarkable. Other: No abdominopelvic free fluid or free gas. No bowel containing hernias. Small fat containing umbilical and bilateral inguinal hernias. Musculoskeletal: Mild multilevel degenerative changes are present in the imaged portions of the lumbar spine and pelvis features are most pronounced at L5-S1. No acute osseous abnormality or suspicious osseous lesion. Review of the MIP images confirms the above findings. IMPRESSION: 1. No evidence of acute aortic syndrome. 2. No acute intrathoracic or intra-abdominal process. 3. Question a nondisplaced fracture at the tip of the T5 spinous process. Correlate for point tenderness. Electronically Signed   By: Kreg Shropshire M.D.   On: 03/24/2019 18:17    ____________________________________________   PROCEDURES  Procedure(s) performed:   Procedures  None  ____________________________________________   INITIAL IMPRESSION / ASSESSMENT AND PLAN / ED COURSE  Pertinent labs & imaging results that were available during my care of the patient were reviewed by me and considered in my medical decision making (see chart for details).   Presents emergency department for evaluation of midthoracic back pain now radiating to the neck.  No neuro deficits.  Pulses are equal.  Likely musculoskeletal but patient has significantly elevated blood pressure here.  Possibly related to pain.  I am considering dissection is a possibility.  Will obtain screening lab work and chest x-ray will give him pain medication and reassess.   Labs and CTA reviewed.  Patient's troponin is 3.  Kidney function and CBC are normal.  CT dissection protocol shows no evidence of aortic dissection or acute intrathoracic or intra-abdominal process.  Patient does have a T5 spinous process fracture which  correlates with the patient's symptoms and pain.  He does not have neuro deficits.  Plan for pain control and PCP follow-up.  Called in a prescription for Percocet.  Reviewed the [place] controlled substance database prior to prescribing. Discussed side effects and ED return precautions.  ____________________________________________  FINAL CLINICAL IMPRESSION(S) / ED DIAGNOSES  Final diagnoses:  Closed fracture of spinous process of thoracic vertebra, initial encounter [facility])     MEDICATIONS GIVEN DURING THIS VISIT:  Medications  sodium chloride 0.9 % bolus 500 mL (0 mLs Intravenous Stopped 03/24/19 1826)  morphine 4 MG/ML injection 4 mg (4 mg Intravenous Given 03/24/19 1637)  ondansetron (ZOFRAN) injection 4 mg (4 mg Intravenous Given 03/24/19 1637)  iohexol (OMNIPAQUE) 350 MG/ML injection 100 mL (100 mLs Intravenous Contrast Given 03/24/19 1734)  oxyCODONE-acetaminophen (PERCOCET/ROXICET) 5-325 MG per tablet 1 tablet (1 tablet Oral Given 03/24/19 1854)     NEW OUTPATIENT MEDICATIONS STARTED DURING THIS VISIT:  New Prescriptions   DICLOFENAC SODIUM (VOLTAREN) 1 % GEL    Apply 2 g topically 4 (four) times daily as needed.   OXYCODONE-ACETAMINOPHEN (PERCOCET/ROXICET) 5-325 MG TABLET    Take 1 tablet by mouth every 6 (six) hours as needed for severe pain.    Note:  This document was prepared using Dragon voice recognition software and may include unintentional dictation errors.  Alona Bene, MD, [facility] Emergency Medicine    [place] Maulden, Arlyss Repress, MD 03/24/19 1901

## 2019-03-24 NOTE — ED Notes (Addendum)
Patient transported to X-ray 

## 2019-03-24 NOTE — Discharge Instructions (Addendum)
You were seen in the emergency department today with back pain.  You have a spinous process fracture at T5.  This is a stable fracture but does cause significant pain.  I have provided pain medication to take as needed for severe pain.  Please follow-up with your primary care doctor.  If you continue to have severe or chronic pain you may need follow-up with a spinal surgeon but these typically do not require surgery.

## 2020-06-05 ENCOUNTER — Other Ambulatory Visit: Payer: Self-pay

## 2020-06-05 ENCOUNTER — Emergency Department (HOSPITAL_BASED_OUTPATIENT_CLINIC_OR_DEPARTMENT_OTHER): Payer: Self-pay

## 2020-06-05 ENCOUNTER — Encounter (HOSPITAL_BASED_OUTPATIENT_CLINIC_OR_DEPARTMENT_OTHER): Payer: Self-pay | Admitting: *Deleted

## 2020-06-05 ENCOUNTER — Emergency Department (HOSPITAL_BASED_OUTPATIENT_CLINIC_OR_DEPARTMENT_OTHER)
Admission: EM | Admit: 2020-06-05 | Discharge: 2020-06-05 | Disposition: A | Discharge status: Home / Self Care | Payer: Self-pay | Attending: Emergency Medicine | Admitting: Emergency Medicine

## 2020-06-05 DIAGNOSIS — M5442 Lumbago with sciatica, left side: Secondary | ICD-10-CM | POA: Insufficient documentation

## 2020-06-05 DIAGNOSIS — R030 Elevated blood-pressure reading, without diagnosis of hypertension: Secondary | ICD-10-CM

## 2020-06-05 DIAGNOSIS — I10 Essential (primary) hypertension: Secondary | ICD-10-CM | POA: Insufficient documentation

## 2020-06-05 DIAGNOSIS — M25561 Pain in right knee: Secondary | ICD-10-CM | POA: Insufficient documentation

## 2020-06-05 MED ORDER — KETOROLAC TROMETHAMINE 15 MG/ML IJ SOLN
15.0000 mg | Freq: Once | INTRAMUSCULAR | Status: AC
  Administered 2020-06-05: 15 mg via INTRAMUSCULAR
  Filled 2020-06-05: qty 1

## 2020-06-05 MED ORDER — PREDNISONE 10 MG PO TABS: 40.0000 mg | ORAL_TABLET | Freq: Every day | ORAL | 0 refills | Status: AC

## 2020-06-05 MED ORDER — LISINOPRIL 20 MG PO TABS: 20.0000 mg | ORAL_TABLET | Freq: Every day | ORAL | 0 refills | Status: AC

## 2020-06-05 MED ORDER — LISINOPRIL 10 MG PO TABS
5.0000 mg | ORAL_TABLET | Freq: Once | ORAL | Status: AC
  Administered 2020-06-05: 5 mg via ORAL
  Filled 2020-06-05: qty 1

## 2020-06-05 MED ORDER — HYDROCHLOROTHIAZIDE 25 MG PO TABS
25.0000 mg | ORAL_TABLET | Freq: Every day | ORAL | Status: DC
  Administered 2020-06-05: 25 mg via ORAL
  Filled 2020-06-05: qty 1

## 2020-06-05 MED ORDER — METHOCARBAMOL 500 MG PO TABS: 500.0000 mg | ORAL_TABLET | Freq: Two times a day (BID) | ORAL | 0 refills | Status: AC

## 2020-06-05 MED ORDER — HYDROCHLOROTHIAZIDE 25 MG PO TABS: 25.0000 mg | ORAL_TABLET | Freq: Every day | ORAL | 0 refills | Status: AC

## 2020-06-05 MED ORDER — OXYCODONE-ACETAMINOPHEN 5-325 MG PO TABS
1.0000 | ORAL_TABLET | Freq: Once | ORAL | Status: AC
  Administered 2020-06-05: 1 via ORAL
  Filled 2020-06-05: qty 1

## 2020-06-05 NOTE — Discharge Instructions (Addendum)
At this time there does not appear to be the presence of an emergent medical condition, however there is always the potential for conditions to change. Please read and follow the below instructions.  Please return to the Emergency Department immediately for any new or worsening symptoms. Please be sure to follow up with your Primary Care Provider within one week regarding your visit today; please call their office to schedule an appointment even if you are feeling better for a follow-up visit. Your blood pressure was elevated in the emergency department today. Please have your blood pressure rechecked by your primary care provider within the next week and discuss medication management at that time. Your blood pressure medications have been refilled today please take as prescribed by your primary care provider. You're given a pain pill in the emergency department today called Percocet, this will make you drowsy so do not drive, operate heavy machinery, drink alcohol or perform any dangerous activities for the rest the day You may use the muscle relaxer Robaxin as prescribed to help with your symptoms.  Do not drive or operate heavy machinery while taking Robaxin as it will make you drowsy.  Do not drink alcohol or take other sedating medications while taking Robaxin as this will worsen side effects. You have been given an NSAID-containing medication called Toradol today.  Do not take the medications including ibuprofen, Aleve, Advil, naproxen or other NSAID-containing medications for the next 2 days.  Please be sure to drink plenty of water over the next few days. You have been prescribed a medication today called prednisone this is a steroid medication which may help with your sciatica pain. Please take as prescribed. You have been referred to Dr. Gary Fleet office today who is an orthopedic specialist. Please call his office for follow-up regarding her right knee pain and left lower back pain. As we  discussed further imaging may be needed if symptoms do not improve. Please continue use the crutches given today to keep weight off your right knee to avoid further injury.  Go to the nearest Emergency Department immediately if: You have fever or chills Get a very bad headache. Start to feel mixed up (confused). Feel weak or numb. Feel faint. Have very bad pain in your: Chest. Belly (abdomen). Throw up more than once. Have trouble breathing. You cannot control when you pee (urinate) or poop (have a bowel movement). You have weakness in any of these areas and it gets worse: Lower back. The area between your hip bones. Butt. Legs. You have redness or swelling of your back. You have a burning feeling when you pee. Your knee swells, and the swelling gets worse. You cannot move your knee. You have very bad knee pain. You have any new/concerning or worsening of symptoms   Please read the additional information packets attached to your discharge summary.  Do not take your medicine if  develop an itchy rash, swelling in your mouth or lips, or difficulty breathing; call 911 and seek immediate emergency medical attention if this occurs.  You may review your lab tests and imaging results in their entirety on your MyChart account.  Please discuss all results of fully with your primary care provider and other specialist at your follow-up visit.  Note: Portions of this text may have been transcribed using voice recognition software. Every effort was made to ensure accuracy; however, inadvertent computerized transcription errors may still be present.

## 2020-06-05 NOTE — ED Triage Notes (Addendum)
Lower back pain with radiation into his left hip. He felt a pop in his right knee. Symptoms since lifting a person that fell a few days ago. Hx of htn. BP noted in both arms. Acuity increased due to HTN crisis. EKG at triage. Pt is asymptomatic for HTN.

## 2020-06-05 NOTE — ED Provider Notes (Signed)
[facility] EMERGENCY DEPARTMENT Provider Note   CSN: [id number] Arrival date & time: 06/05/20  1259     History No chief complaint on file.   Mitchell Brooks is a 45 y.o. male history of chronic back pain and hypertension.  Patient presents to the ER today for right knee pain and left hip pain onset 3 days ago.  He reports that he was helping pick up his mother off of the bed, he heard a pop in his right knee.  He reports moderate intensity medial right knee pain constant nonradiating worsened with ambulation slightly improved with ibuprofen since that time.  Additionally a few hours after lifting up his mother he noticed left hip pain he describes as a moderate-severe in intensity intermittent sharp throb that will occasionally radiate down to his left foot worsened with lifting the leg improved with rest and ibuprofen.  Patient denies fever/chills, fall/injury, chest pain, abdominal pain, shortness of breath, vomiting, diarrhea, dysuria/hematuria, numbness/weakness, tingling, saddle area paresthesias, urinary retention, incontinence, IV drug use, history of cancer or any additional concerns.  Of note patient reports that he was unaware that he was hypertensive prior to arrival he reports that he has not taken his lisinopril or hydrochlorothiazide today.  HPI     Past Medical History:  Diagnosis Date  . Back pain   . Hypertension     There are no problems to display for this patient.   Past Surgical History:  Procedure Laterality Date  . EYE SURGERY         No family history on file.  Social History   Tobacco Use  . Smoking status: Never Smoker  . Smokeless tobacco: Never Used  Vaping Use  . Vaping Use: Never used  Substance Use Topics  . Alcohol use: No  . Drug use: No    Home Medications Prior to Admission medications   Medication Sig Start Date End Date Taking? Authorizing Provider  ibuprofen (ADVIL,MOTRIN) 600 MG tablet Take 1 tablet (600 mg  total) by mouth every 6 (six) hours as needed. 05/24/17  Yes Audry Pili, PA-C  methocarbamol (ROBAXIN) 500 MG tablet Take 1 tablet (500 mg total) by mouth 2 (two) times daily for 7 days. 06/05/20 06/12/20 Yes Harlene Salts A, PA-C  predniSONE (DELTASONE) 10 MG tablet Take 4 tablets (40 mg total) by mouth daily for 5 days. 06/05/20 06/10/20 Yes Harlene Salts A, PA-C  atenolol (TENORMIN) 50 MG tablet Take by mouth. 08/08/18   [provider]  diclofenac sodium (VOLTAREN) 1 % GEL Apply 2 g topically 4 (four) times daily as needed. 03/24/19   Long, Arlyss Repress, MD  gabapentin (NEURONTIN) 300 MG capsule Take 300 mg by mouth 3 (three) times daily.    [provider]  hydrochlorothiazide (HYDRODIURIL) 25 MG tablet Take 1 tablet (25 mg total) by mouth daily. 06/05/20   Harlene Salts A, PA-C  lisinopril (ZESTRIL) 20 MG tablet Take 1 tablet (20 mg total) by mouth daily. 06/05/20   Bill Salinas, PA-C  oxyCODONE-acetaminophen (PERCOCET/ROXICET) 5-325 MG tablet Take 1 tablet by mouth every 6 (six) hours as needed for severe pain. 03/24/19   Long, Arlyss Repress, MD    Allergies    Patient has no known allergies.  Review of Systems   Review of Systems Ten systems are reviewed and are negative for acute change except as noted in the HPI  Physical Exam Updated Vital Signs BP (!) 172/126 (BP Location: Right Arm)   Pulse 97  Temp 98 F (36.7 C) (Oral)   Resp 20   Ht 6\' 1"  (1.854 m)   Wt 124 kg   SpO2 98%   BMI 36.06 kg/m   Physical Exam Constitutional:      General: He is not in acute distress.    Appearance: Normal appearance. He is well-developed. He is not ill-appearing or diaphoretic.  HENT:     Head: Normocephalic and atraumatic.  Eyes:     General: Vision grossly intact. Gaze aligned appropriately.     Pupils: Pupils are equal, round, and reactive to light.  Neck:     Trachea: Trachea and phonation normal.  Cardiovascular:     Rate and Rhythm: Normal rate and regular  rhythm.     Pulses:          Dorsalis pedis pulses are 2+ on the right side and 2+ on the left side.  Pulmonary:     Effort: Pulmonary effort is normal. No respiratory distress.     Breath sounds: Normal air entry.  Abdominal:     General: There is no distension.     Palpations: Abdomen is soft. There is no pulsatile mass.     Tenderness: There is no abdominal tenderness. There is no guarding or rebound.  Musculoskeletal:        General: Normal range of motion.     Cervical back: Normal range of motion.       Back:     Comments: No midline C/T/L spinal tenderness to palpation, no deformity, crepitus, or step-off noted. No sign of injury to the neck or back. - Left lumbar paraspinal muscular tenderness without overlying skin change.  Left gluteal muscular tenderness without overlying skin change.  Positive left straight leg raise. - Right Knee:  Appearance normal. No obvious deformity. No skin swelling, erythema, heat, fluctuance or break of the skin.  Tenderness over medial joint line without overlying skin change.  Active flexion and extension intact with minimal increase in pain. Negative anterior/poster drawer bilaterally. Negative McMurray's test. No varus or valgus laxity or locking.Compartments soft. Neurovascularly intact distally to site of injury. - All other major joints of the upper and lower extremities mobilized without pain or difficulty.  Skin:    General: Skin is warm and dry.  Neurological:     Mental Status: He is alert.     GCS: GCS eye subscore is 4. GCS verbal subscore is 5. GCS motor subscore is 6.     Comments: Speech is clear and goal oriented, follows commands Major Cranial nerves without deficit, no facial droop Normal strength in upper and lower extremities bilaterally including dorsiflexion and plantar flexion, strong and equal grip strength Sensation normal to light and sharp touch Moves extremities without ataxia, coordination intact  Psychiatric:         Behavior: Behavior normal.    ED Results / Procedures / Treatments   Labs (all labs ordered are listed, but only abnormal results are displayed) Labs Reviewed - No data to display  EKG EKG Interpretation  Date/Time:  Friday June 05 2020 13:20:18 EST Ventricular Rate:  103 PR Interval:  152 QRS Duration: 82 QT Interval:  338 QTC Calculation: 442 R Axis:   -84 Text Interpretation: Sinus tachycardia Pulmonary disease pattern Left anterior fascicular block Abnormal ECG Confirmed by [phone] [id number]) on 06/05/2020 5:01:32 PM   Radiology DG Knee Complete 4 Views Right  Result Date: 06/05/2020 CLINICAL DATA:  Medial pain after lifting injury EXAM: RIGHT KNEE - COMPLETE  4+ VIEW COMPARISON:  11/28/2011 FINDINGS: No fracture or dislocation. No effusion. Tiny marginal spurs from the patellar articular surface and medial tibial plateau. Normal alignment and mineralization. Regional soft tissues unremarkable. IMPRESSION: Negative for fracture or other acute bone abnormality. Electronically Signed   By: Lucrezia Europe M.D.   On: 06/05/2020 15:17    Procedures Procedures (including critical care time)  Medications Ordered in ED Medications  hydrochlorothiazide (HYDRODIURIL) tablet 25 mg (25 mg Oral Given 06/05/20 1541)  ketorolac (TORADOL) 15 MG/ML injection 15 mg (has no administration in time range)  oxyCODONE-acetaminophen (PERCOCET/ROXICET) 5-325 MG per tablet 1 tablet (1 tablet Oral Given 06/05/20 1513)  lisinopril (ZESTRIL) tablet 5 mg (5 mg Oral Given 06/05/20 1541)    ED Course  I have reviewed the triage vital signs and the nursing notes.  Pertinent labs & imaging results that were available during my care of the patient were reviewed by me and considered in my medical decision making (see chart for details).    MDM Rules/Calculators/A&P                          Additional history obtained from: 1. Nursing notes from this visit. 2. Review of electronic medical records.  Patient  had ED visit on 01/28/2020 for accidental opiate overdose appears patient snorted some heroin. - 45 year old male presented for right medial joint line knee pain and left-sided sciatica that occurred after lifting his mother up off the bed 3 days ago.   ----------------------- DG Right Knee:  IMPRESSION:  Negative for fracture or other acute bone abnormality.   Right Knee Pain Patient's right knee pain likely ligamentous versus meniscal injury given history and presentation today. No evidence for septic arthritis, DVT, compartment syndrome, cellulitis or other emergent pathologies. He is neurovascular tact distally. Good range of motion and strength but some increased pain with movement. Will place in a immobilizer, give crutches and referred to orthopedics for follow-up.  Left Lower Back Pain w/ Sciatica Patient's left hip pain is consistent with left-sided sciatica, history of chronic back pain. He is neurovascular intact distally no evidence for cellulitis, septic arthritis, compartment syndrome, neurovascular compromise or traumatic injury to result in fracture/dislocation. No indication for imaging at this time. I discussed patient's history of opioid use, he reports that earlier this year he was given what he thought was Goody's powder which he took and this turned out to be heroin. He denies any history of IV drug use. Low suspicion for spinal epidural abscess, cauda equina, fracture/dislocation, AAA, kidney stone disease or other emergent pathologies at this time. Will treat with Toradol, Robaxin and prednisone burst. Of note patient denies history of diabetes or adverse reaction to steroid medications. We discussed muscle Rexer precautions and he states understanding.  Asymptomatic Hypertension Finally patient noted be hypertensive on ER arrival this improved after he was given pain medication and his daily blood pressure medication. He is not consistent with his daily blood pressure  medication. He denies any symptoms to suggest hypertensive urgency/emergency today. Patient was unaware his blood pressure was elevated prior to arrival, he arrived only for left lower back pain and right knee pain. Physical examination is reassuring. Vital signs improving following pain control. Low suspicion for dissection or other emergent pathologies at this time. Will refill patient's blood pressure medication and I asked the patient call his primary care provider's office today to schedule follow-up appointment next week for blood pressure recheck and medication management. He  is asymptomatic regarding elevated blood pressure reading today, patient informed of signs/symptoms of hypertensive urgency/emergency and to call 911 and immediately return to the emergency department if they occur.  At this time there does not appear to be any evidence of an acute emergency medical condition and the patient appears stable for discharge with appropriate outpatient follow up. Diagnosis was discussed with patient who verbalizes understanding of care plan and is agreeable to discharge. I have discussed return precautions with patient who verbalizes understanding. Patient encouraged to follow-up with their PCP and Ortho. All questions answered.  Patient's case discussed with Dr. Madilyn Hook who agrees with plan to discharge with follow-up.   Note: Portions of this report may have been transcribed using voice recognition software. Every effort was made to ensure accuracy; however, inadvertent computerized transcription errors may still be present. Final Clinical Impression(s) / ED Diagnoses Final diagnoses:  Elevated blood pressure reading  Left-sided low back pain with left-sided sciatica, unspecified chronicity  Acute pain of right knee    Rx / DC Orders ED Discharge Orders         Ordered    hydrochlorothiazide (HYDRODIURIL) 25 MG tablet  Daily        06/05/20 1737    lisinopril (ZESTRIL) 20 MG tablet  Daily         06/05/20 1737    predniSONE (DELTASONE) 10 MG tablet  Daily        06/05/20 1737    methocarbamol (ROBAXIN) 500 MG tablet  2 times daily        06/05/20 1737           Elizabeth Palau 06/05/20 1739    Tilden Fossa, MD 06/05/20 1746

## 2020-06-05 NOTE — ED Notes (Signed)
Called both [facility] and [facility] in [place].  Pt has not had any BP medications filled in years

## 2020-09-27 DEATH — deceased

## 2022-06-09 IMAGING — DX DG KNEE COMPLETE 4+V*R*
4 series · 4 of 4 positions shown · non-contrast
Comparison: 11/28/2011

CLINICAL DATA: Medial pain after lifting injury

EXAM:
RIGHT KNEE - COMPLETE 4+ VIEW

[knee ap]
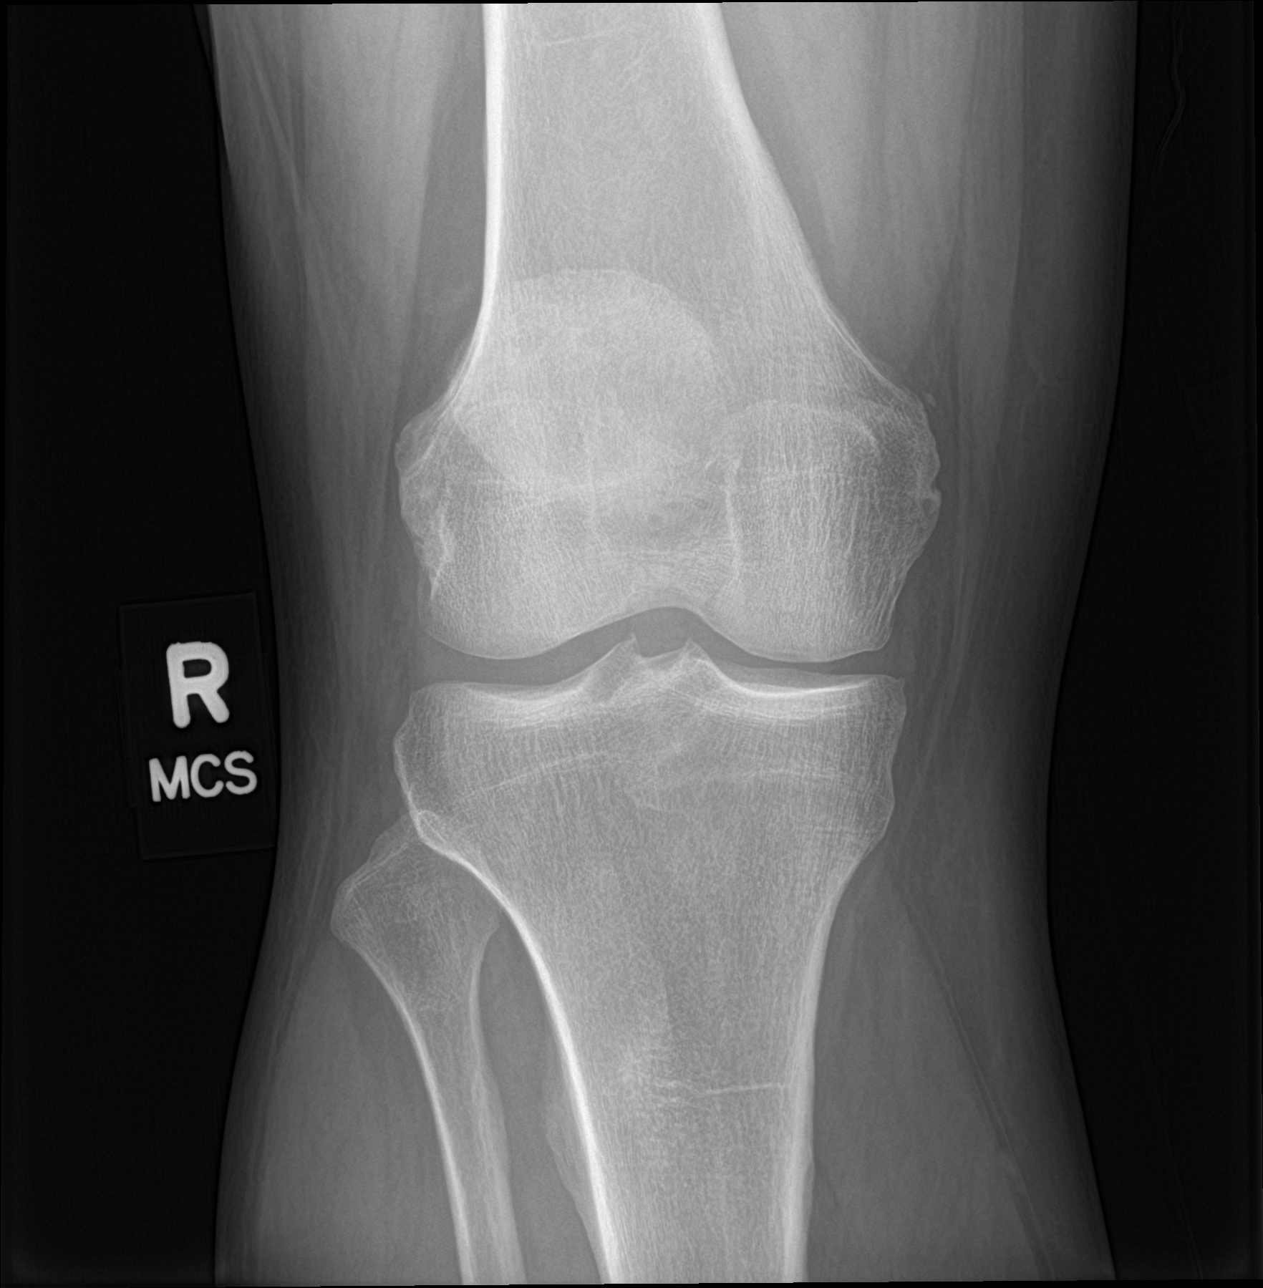

[knee lat]
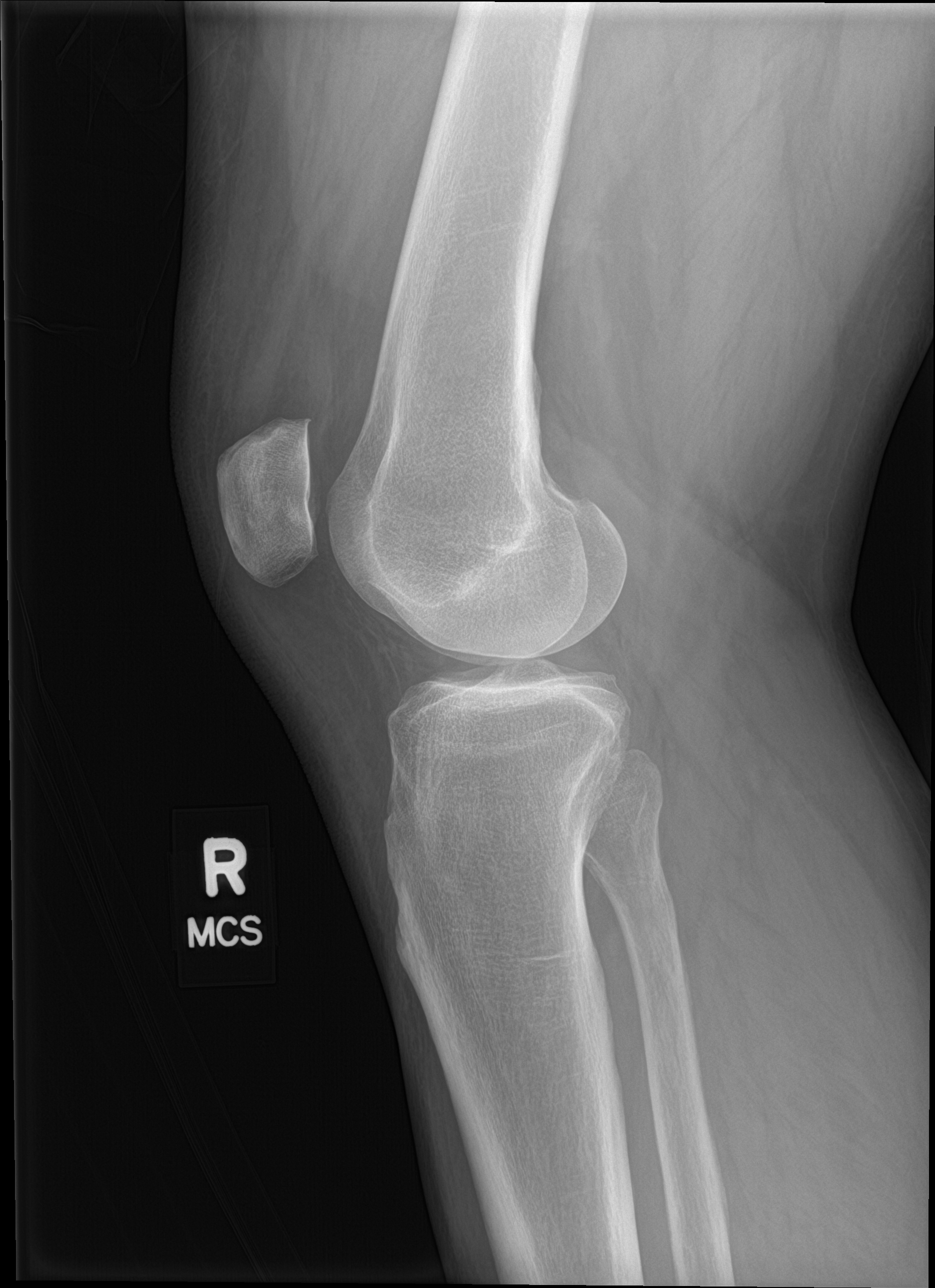

[knee obl (1 of 2)]
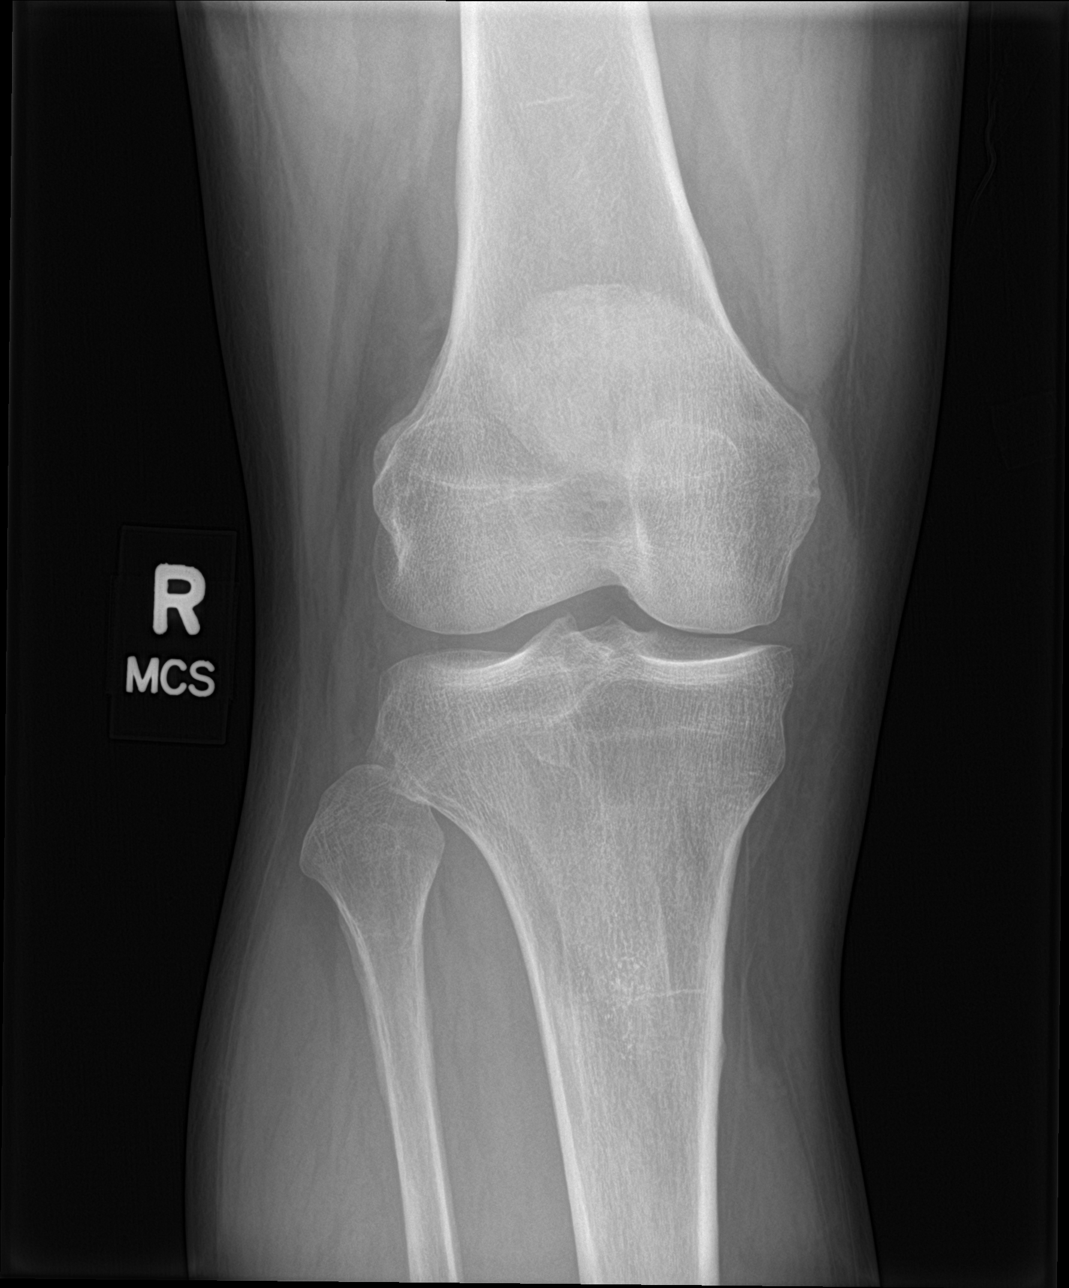

[knee obl (2 of 2)]
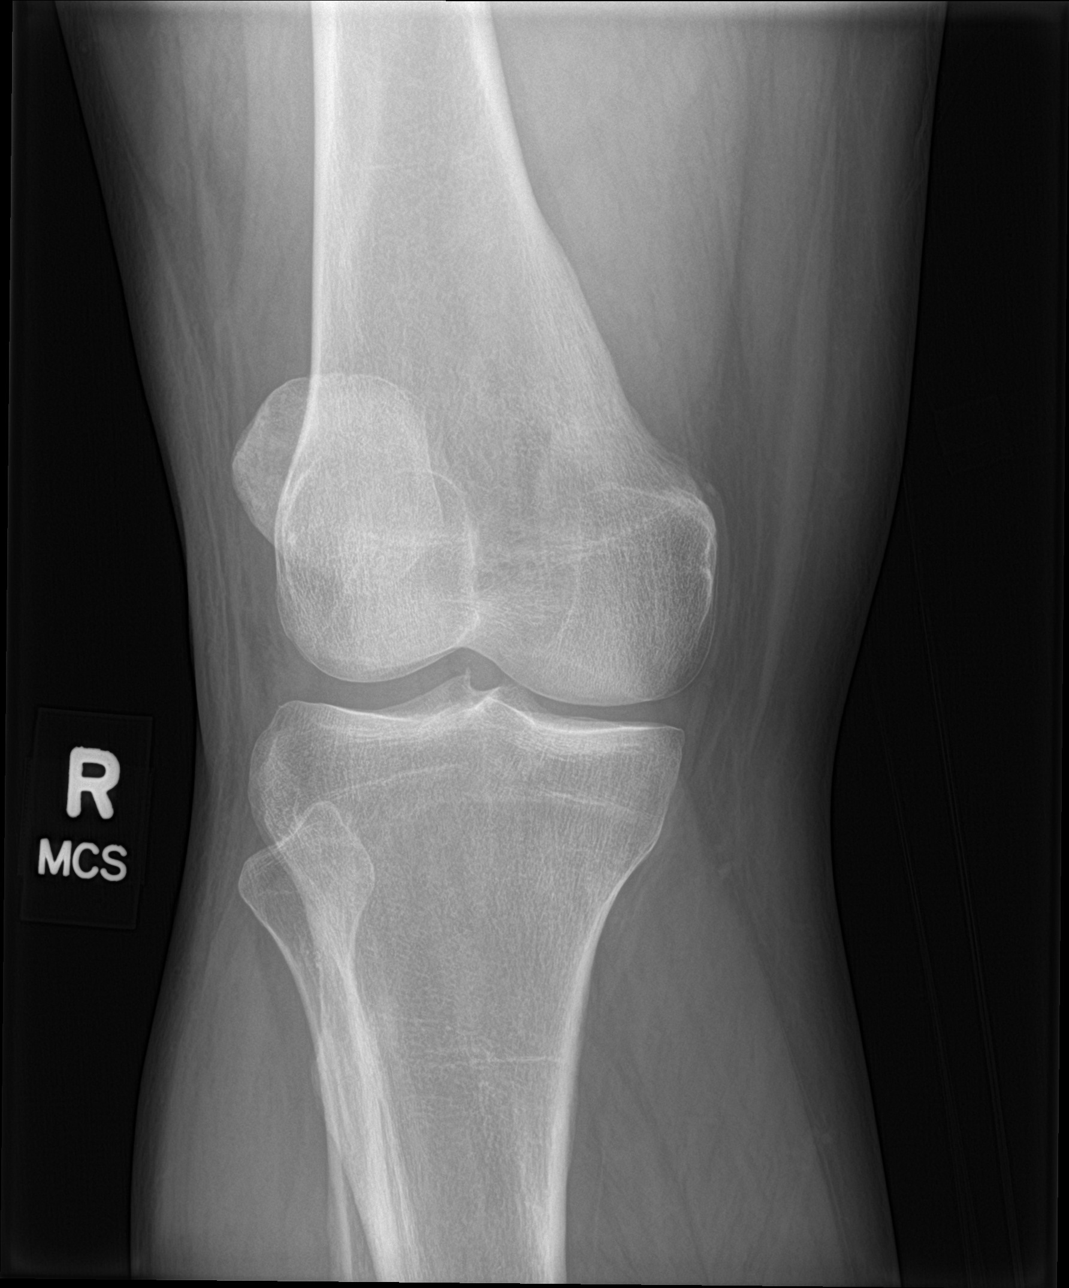

[4 of 4 positions shown; findings below may reference images not displayed]

FINDINGS: No fracture or dislocation. No effusion. Tiny marginal spurs from
the patellar articular surface and medial tibial plateau. Normal
alignment and mineralization. Regional soft tissues unremarkable.
IMPRESSION: Negative for fracture or other acute bone abnormality.
